# Patient Record
Sex: Male | Born: 1945
Health system: Southern US, Community
[De-identification: ages and names within clinical notes are randomized; demographics above are authoritative.]

## PROBLEM LIST (undated history)

## (undated) DIAGNOSIS — E785 Hyperlipidemia, unspecified: Secondary | ICD-10-CM

## (undated) DIAGNOSIS — I2510 Atherosclerotic heart disease of native coronary artery without angina pectoris: Secondary | ICD-10-CM

## (undated) DIAGNOSIS — M1990 Unspecified osteoarthritis, unspecified site: Secondary | ICD-10-CM

## (undated) HISTORY — DX: Hyperlipidemia, unspecified: E78.5

## (undated) HISTORY — PX: BACK SURGERY: SHX140

## (undated) HISTORY — DX: Atherosclerotic heart disease of native coronary artery without angina pectoris: I25.10

---

## 2010-08-09 HISTORY — PX: BIOPSY THYROID: PRO38

## 2014-04-16 ENCOUNTER — Encounter (HOSPITAL_COMMUNITY): Payer: Self-pay | Admitting: Pharmacy Technician

## 2014-04-16 NOTE — Patient Instructions (Signed)
Dale Elliott  04/16/2014   Your procedure is scheduled on:  04/22/14  Report to [facility] at *7:30 AM.  Call this number if you have problems the morning of surgery: [phone]   Remember:   Do not eat food or drink liquids after midnight.   Take these medicines the morning of surgery with A SIP OF WATER: N/A   Do not wear jewelry, make-up or nail polish.  Do not wear lotions, powders, or perfumes. You may wear deodorant.  Do not shave 48 hours prior to surgery. Men may shave face and neck.  Do not bring valuables to the hospital.  [facility] is not responsible for any belongings or valuables.               Contacts, dentures or bridgework may not be worn into surgery.  Leave suitcase in the car. After surgery it may be brought to your room.  For patients admitted to the hospital, discharge time is determined by your treatment team.               Patients discharged the day of surgery will not be allowed to drive home.  Name and phone number of your driver:   Special Instructions: N/A   Please read over the following fact sheets that you were given: Anesthesia Post-op Instructions   PATIENT INSTRUCTIONS POST-ANESTHESIA  IMMEDIATELY FOLLOWING SURGERY:  Do not drive or operate machinery for the first twenty four hours after surgery.  Do not make any important decisions for twenty four hours after surgery or while taking narcotic pain medications or sedatives.  If you develop intractable nausea and vomiting or a severe headache please notify your doctor immediately.  FOLLOW-UP:  Please make an appointment with your surgeon as instructed. You do not need to follow up with anesthesia unless specifically instructed to do so.  WOUND CARE INSTRUCTIONS (if applicable):  Keep a dry clean dressing on the anesthesia/puncture wound site if there is drainage.  Once the wound has quit draining you may leave it open to air.  Generally you should leave the bandage intact for twenty four hours  unless there is drainage.  If the epidural site drains for more than 36-48 hours please call the anesthesia department.  QUESTIONS?:  Please feel free to call your physician or the hospital operator if you have any questions, and they will be happy to assist you.      Cataract Surgery  A cataract is a clouding of the lens of the eye. When a lens becomes cloudy, vision is reduced based on the degree and nature of the clouding. Surgery may be needed to improve vision. Surgery removes the cloudy lens and usually replaces it with a substitute lens (intraocular lens, IOL). LET YOUR EYE DOCTOR KNOW ABOUT:  Allergies to food or medicine.  Medicines taken including herbs, eye drops, over-the-counter medicines, and creams.  Use of steroids (by mouth or creams).  Previous problems with anesthetics or numbing medicine.  History of bleeding problems or blood clots.  Previous surgery.  Other health problems, including diabetes and kidney problems.  Possibility of pregnancy, if this applies. RISKS AND COMPLICATIONS  Infection.  Inflammation of the eyeball (endophthalmitis) that can spread to both eyes (sympathetic ophthalmia).  Poor wound healing.  If an IOL is inserted, it can later fall out of proper position. This is very uncommon.  Clouding of the part of your eye that holds an IOL in place. This is called an "after-cataract." These  are uncommon but easily treated. BEFORE THE PROCEDURE  Do not eat or drink anything except small amounts of water for 8 to 12 before your surgery, or as directed by your caregiver.  Unless you are told otherwise, continue any eye drops you have been prescribed.  Talk to your primary caregiver about all other medicines that you take (both prescription and nonprescription). In some cases, you may need to stop or change medicines near the time of your surgery. This is most important if you are taking blood-thinning medicine.Do not stop medicines unless you  are told to do so.  Arrange for someone to drive you to and from the procedure.  Do not put contact lenses in either eye on the day of your surgery. PROCEDURE There is more than one method for safely removing a cataract. Your doctor can explain the differences and help determine which is best for you. Phacoemulsification surgery is the most common form of cataract surgery.  An injection is given behind the eye or eye drops are given to make this a painless procedure.  A small cut (incision) is made on the edge of the clear, dome-shaped surface that covers the front of the eye (cornea).  A tiny probe is painlessly inserted into the eye. This device gives off ultrasound waves that soften and break up the cloudy center of the lens. This makes it easier for the cloudy lens to be removed by suction.  An IOL may be implanted.  The normal lens of the eye is covered by a clear capsule. Part of that capsule is intentionally left in the eye to support the IOL.  Your surgeon may or may not use stitches to close the incision. There are other forms of cataract surgery that require a larger incision and stitches to close the eye. This approach is taken in cases where the doctor feels that the cataract cannot be easily removed using phacoemulsification. AFTER THE PROCEDURE  When an IOL is implanted, it does not need care. It becomes a permanent part of your eye and cannot be seen or felt.  Your doctor will schedule follow-up exams to check on your progress.  Review your other medicines with your doctor to see which can be resumed after surgery.  Use eye drops or take medicine as prescribed by your doctor. Document Released: 07/15/2011 Document Revised: 12/10/2013 Document Reviewed: 07/15/2011 [facility] Patient Information 2015 [place], [place]. This information is not intended to replace advice given to you by your health care provider. Make sure you discuss any questions you have with your health care  provider.

## 2014-04-17 ENCOUNTER — Encounter (HOSPITAL_COMMUNITY): Payer: Self-pay

## 2014-04-17 ENCOUNTER — Encounter (HOSPITAL_COMMUNITY)
Admission: RE | Admit: 2014-04-17 | Discharge: 2014-04-17 | Disposition: A | Discharge status: Home / Self Care | Payer: Medicare HMO | Source: Ambulatory Visit | Attending: Ophthalmology | Admitting: Ophthalmology

## 2014-04-17 DIAGNOSIS — H2510 Age-related nuclear cataract, unspecified eye: Secondary | ICD-10-CM | POA: Insufficient documentation

## 2014-04-17 DIAGNOSIS — Z01818 Encounter for other preprocedural examination: Secondary | ICD-10-CM | POA: Diagnosis present

## 2014-04-17 HISTORY — DX: Unspecified osteoarthritis, unspecified site: M19.90

## 2014-04-17 LAB — BASIC METABOLIC PANEL
Anion gap: 10 (ref 5–15)
BUN: 16 mg/dL (ref 6–23)
CO2: 27 mEq/L (ref 19–32)
CREATININE: 0.7 mg/dL (ref 0.50–1.35)
Calcium: 9.3 mg/dL (ref 8.4–10.5)
Chloride: 104 mEq/L (ref 96–112)
GFR calc non Af Amer: >90 mL/min (ref >90)
Glucose, Bld: 104 mg/dL — ABNORMAL HIGH (ref 70–99)
Potassium: 4.3 mEq/L (ref 3.7–5.3)
Sodium: 141 mEq/L (ref 137–147)

## 2014-04-17 LAB — HEMOGLOBIN AND HEMATOCRIT, BLOOD
HCT: 44.1 % (ref 39.0–52.0)
HEMOGLOBIN: 15.2 g/dL (ref 13.0–17.0)

## 2014-04-19 MED ORDER — CYCLOPENTOLATE-PHENYLEPHRINE OP SOLN OPTIME - NO CHARGE
OPHTHALMIC | Status: AC
  Filled 2014-04-19: qty 2

## 2014-04-19 MED ORDER — PHENYLEPHRINE HCL 2.5 % OP SOLN
OPHTHALMIC | Status: AC
  Filled 2014-04-19: qty 15

## 2014-04-19 MED ORDER — NEOMYCIN-POLYMYXIN-DEXAMETH 3.5-10000-0.1 OP SUSP
OPHTHALMIC | Status: AC
  Filled 2014-04-19: qty 5

## 2014-04-19 MED ORDER — LIDOCAINE HCL (PF) 1 % IJ SOLN
INTRAMUSCULAR | Status: AC
  Filled 2014-04-19: qty 2

## 2014-04-19 MED ORDER — LIDOCAINE HCL 3.5 % OP GEL
OPHTHALMIC | Status: AC
  Filled 2014-04-19: qty 1

## 2014-04-19 MED ORDER — TETRACAINE HCL 0.5 % OP SOLN
OPHTHALMIC | Status: AC
  Filled 2014-04-19: qty 2

## 2014-04-22 ENCOUNTER — Ambulatory Visit (HOSPITAL_COMMUNITY): Payer: Medicare HMO | Admitting: Anesthesiology

## 2014-04-22 ENCOUNTER — Encounter (HOSPITAL_COMMUNITY): Payer: Self-pay | Admitting: *Deleted

## 2014-04-22 ENCOUNTER — Encounter (HOSPITAL_COMMUNITY)
Admission: RE | Disposition: A | Discharge status: Home / Self Care | Payer: Self-pay | Source: Ambulatory Visit | Attending: Ophthalmology

## 2014-04-22 ENCOUNTER — Ambulatory Visit (HOSPITAL_COMMUNITY)
Admission: RE | Admit: 2014-04-22 | Discharge: 2014-04-22 | Disposition: A | Discharge status: Home / Self Care | Payer: Medicare HMO | Source: Ambulatory Visit | Attending: Ophthalmology | Admitting: Ophthalmology

## 2014-04-22 ENCOUNTER — Encounter (HOSPITAL_COMMUNITY): Payer: Medicare HMO | Admitting: Anesthesiology

## 2014-04-22 DIAGNOSIS — H2510 Age-related nuclear cataract, unspecified eye: Secondary | ICD-10-CM | POA: Diagnosis present

## 2014-04-22 DIAGNOSIS — F17200 Nicotine dependence, unspecified, uncomplicated: Secondary | ICD-10-CM | POA: Insufficient documentation

## 2014-04-22 DIAGNOSIS — Z7982 Long term (current) use of aspirin: Secondary | ICD-10-CM | POA: Diagnosis not present

## 2014-04-22 HISTORY — PX: CATARACT EXTRACTION W/PHACO: SHX586

## 2014-04-22 SURGERY — PHACOEMULSIFICATION, CATARACT, WITH IOL INSERTION
Anesthesia: Monitor Anesthesia Care | Site: Eye | Laterality: Left

## 2014-04-22 MED ORDER — PHENYLEPHRINE HCL 2.5 % OP SOLN
1.0000 [drp] | OPHTHALMIC | Status: AC
  Administered 2014-04-22 (×3): 1 [drp] via OPHTHALMIC

## 2014-04-22 MED ORDER — LIDOCAINE 3.5 % OP GEL OPTIME - NO CHARGE
OPHTHALMIC | Status: DC | PRN
  Administered 2014-04-22: 2 [drp] via OPHTHALMIC

## 2014-04-22 MED ORDER — EPINEPHRINE HCL 1 MG/ML IJ SOLN
INTRAMUSCULAR | Status: AC
  Filled 2014-04-22: qty 1

## 2014-04-22 MED ORDER — MIDAZOLAM HCL 2 MG/2ML IJ SOLN
INTRAMUSCULAR | Status: AC
  Filled 2014-04-22: qty 2

## 2014-04-22 MED ORDER — LIDOCAINE HCL 3.5 % OP GEL
1.0000 "application " | Freq: Once | OPHTHALMIC | Status: AC
  Administered 2014-04-22: 1 via OPHTHALMIC

## 2014-04-22 MED ORDER — NEOMYCIN-POLYMYXIN-DEXAMETH 3.5-10000-0.1 OP SUSP
OPHTHALMIC | Status: DC | PRN
  Administered 2014-04-22: 2 [drp] via OPHTHALMIC

## 2014-04-22 MED ORDER — MIDAZOLAM HCL 2 MG/2ML IJ SOLN
1.0000 mg | INTRAMUSCULAR | Status: DC | PRN
  Administered 2014-04-22: 2 mg via INTRAVENOUS

## 2014-04-22 MED ORDER — TETRACAINE HCL 0.5 % OP SOLN
1.0000 [drp] | OPHTHALMIC | Status: AC
  Administered 2014-04-22 (×3): 1 [drp] via OPHTHALMIC

## 2014-04-22 MED ORDER — CYCLOPENTOLATE-PHENYLEPHRINE 0.2-1 % OP SOLN
1.0000 [drp] | OPHTHALMIC | Status: AC
  Administered 2014-04-22 (×3): 1 [drp] via OPHTHALMIC

## 2014-04-22 MED ORDER — LIDOCAINE HCL (PF) 1 % IJ SOLN
INTRAMUSCULAR | Status: DC | PRN
  Administered 2014-04-22: .4 mL

## 2014-04-22 MED ORDER — BSS IO SOLN
INTRAOCULAR | Status: DC | PRN
  Administered 2014-04-22: 15 mL via INTRAOCULAR

## 2014-04-22 MED ORDER — FENTANYL CITRATE 0.05 MG/ML IJ SOLN
INTRAMUSCULAR | Status: AC
  Filled 2014-04-22: qty 2

## 2014-04-22 MED ORDER — LACTATED RINGERS IV SOLN
INTRAVENOUS | Status: DC
  Administered 2014-04-22: 08:00:00 via INTRAVENOUS

## 2014-04-22 MED ORDER — KETOROLAC TROMETHAMINE 0.5 % OP SOLN: 1.0000 [drp] | OPHTHALMIC | Status: DC

## 2014-04-22 MED ORDER — EPINEPHRINE HCL 1 MG/ML IJ SOLN
INTRAOCULAR | Status: DC | PRN
  Administered 2014-04-22: 09:00:00

## 2014-04-22 MED ORDER — PROVISC 10 MG/ML IO SOLN
INTRAOCULAR | Status: DC | PRN
  Administered 2014-04-22: 0.85 mL via INTRAOCULAR

## 2014-04-22 MED ORDER — POVIDONE-IODINE 5 % OP SOLN
OPHTHALMIC | Status: DC | PRN
  Administered 2014-04-22: 1 via OPHTHALMIC

## 2014-04-22 MED ORDER — FENTANYL CITRATE 0.05 MG/ML IJ SOLN
25.0000 ug | INTRAMUSCULAR | Status: AC
  Administered 2014-04-22 (×2): 25 ug via INTRAVENOUS

## 2014-04-22 SURGICAL SUPPLY — 33 items
CAPSULAR TENSION RING-AMO (OPHTHALMIC RELATED) IMPLANT
CLOTH BEACON ORANGE TIMEOUT ST (SAFETY) ×1 IMPLANT
EYE SHIELD UNIVERSAL CLEAR (GAUZE/BANDAGES/DRESSINGS) ×1 IMPLANT
GLOVE BIO SURGEON STRL SZ 6.5 (GLOVE) IMPLANT
GLOVE BIOGEL PI IND STRL 6.5 (GLOVE) IMPLANT
GLOVE BIOGEL PI IND STRL 7.0 (GLOVE) IMPLANT
GLOVE BIOGEL PI IND STRL 7.5 (GLOVE) IMPLANT
GLOVE BIOGEL PI INDICATOR 6.5 (GLOVE)
GLOVE BIOGEL PI INDICATOR 7.0 (GLOVE) ×1
GLOVE BIOGEL PI INDICATOR 7.5 (GLOVE)
GLOVE ECLIPSE 6.5 STRL STRAW (GLOVE) IMPLANT
GLOVE ECLIPSE 7.0 STRL STRAW (GLOVE) IMPLANT
GLOVE ECLIPSE 7.5 STRL STRAW (GLOVE) IMPLANT
GLOVE EXAM NITRILE LRG STRL (GLOVE) IMPLANT
GLOVE EXAM NITRILE MD LF STRL (GLOVE) ×1 IMPLANT
GLOVE SKINSENSE NS SZ6.5 (GLOVE)
GLOVE SKINSENSE NS SZ7.0 (GLOVE)
GLOVE SKINSENSE STRL SZ6.5 (GLOVE) IMPLANT
GLOVE SKINSENSE STRL SZ7.0 (GLOVE) IMPLANT
KIT VITRECTOMY (OPHTHALMIC RELATED) IMPLANT
PAD ARMBOARD 7.5X6 YLW CONV (MISCELLANEOUS) ×1 IMPLANT
PROC W NO LENS (INTRAOCULAR LENS)
PROC W SPEC LENS (INTRAOCULAR LENS)
PROCESS W NO LENS (INTRAOCULAR LENS) IMPLANT
PROCESS W SPEC LENS (INTRAOCULAR LENS) IMPLANT
RETRACTOR IRIS SIGHTPATH (OPHTHALMIC RELATED) IMPLANT
RING MALYGIN (MISCELLANEOUS) IMPLANT
SIGHTPATH CAT PROC W REG LENS (Ophthalmic Related) ×2 IMPLANT
SYRINGE LUER LOK 1CC (MISCELLANEOUS) ×1 IMPLANT
TAPE SURG TRANSPORE 1 IN (GAUZE/BANDAGES/DRESSINGS) IMPLANT
TAPE SURGICAL TRANSPORE 1 IN (GAUZE/BANDAGES/DRESSINGS) ×1
VISCOELASTIC ADDITIONAL (OPHTHALMIC RELATED) IMPLANT
WATER STERILE IRR 250ML POUR (IV SOLUTION) ×1 IMPLANT

## 2014-04-22 NOTE — Transfer of Care (Signed)
Immediate Anesthesia Transfer of Care Note  Patient: Dale Elliott  Procedure(s) Performed: Procedure(s) with comments: CATARACT EXTRACTION PHACO AND INTRAOCULAR LENS PLACEMENT (IOC) (Left) - CDE:  9.68  Patient Location: Short Stay  Anesthesia Type:MAC  Level of Consciousness: awake  Airway & Oxygen Therapy: Patient Spontanous Breathing  Post-op Assessment: Report given to PACU RN  Post vital signs: Reviewed  Complications: No apparent anesthesia complications

## 2014-04-22 NOTE — Anesthesia Postprocedure Evaluation (Signed)
  Anesthesia Post-op Note  Patient: Dale Elliott  Procedure(s) Performed: Procedure(s) with comments: CATARACT EXTRACTION PHACO AND INTRAOCULAR LENS PLACEMENT (IOC) (Left) - CDE:  9.68  Patient Location: Short Stay  Anesthesia Type:MAC  Level of Consciousness: awake, alert  and oriented  Airway and Oxygen Therapy: Patient Spontanous Breathing and Patient connected to face mask oxygen  Post-op Pain: none  Post-op Assessment: Post-op Vital signs reviewed, Patient's Cardiovascular Status Stable, Respiratory Function Stable, Patent Airway and No signs of Nausea or vomiting  Post-op Vital Signs: Reviewed and stable  Last Vitals:  Filed Vitals:   04/22/14 0850  BP: 112/59  Temp:   Resp: 23    Complications: No apparent anesthesia complications

## 2014-04-22 NOTE — Op Note (Signed)
Date of Admission: 04/22/2014  Date of Surgery: 04/22/2014   Pre-Op Dx: Cataract Left Eye  Post-Op Dx: Senile Nuclear  Cataract Left  Eye,  Dx Code 366.16  Surgeon: Tonny Branch, M.D.  Assistants: None  Anesthesia: Topical with MAC  Indications: Painless, progressive loss of vision with compromise of daily activities.  Surgery: Cataract Extraction with Intraocular lens Implant Left Eye  Discription: The patient had dilating drops and viscous lidocaine placed into the Left eye in the pre-op holding area. After transfer to the operating room, a time out was performed. The patient was then prepped and draped. Beginning with a 17 degree blade a paracentesis port was made at the surgeon's 2 o'clock position. The anterior chamber was then filled with 1% non-preserved lidocaine. This was followed by filling the anterior chamber with Provisc.  A 2.71mm keratome blade was used to make a clear corneal incision at the temporal limbus.  A bent cystatome needle was used to create a continuous tear capsulotomy. Hydrodissection was performed with balanced salt solution on a Fine canula. The lens nucleus was then removed using the phacoemulsification handpiece. Residual cortex was removed with the I&A handpiece. The anterior chamber and capsular bag were refilled with Provisc. A posterior chamber intraocular lens was placed into the capsular bag with it's injector. The implant was positioned with the Kuglan hook. The Provisc was then removed from the anterior chamber and capsular bag with the I&A handpiece. Stromal hydration of the main incision and paracentesis port was performed with BSS on a Fine canula. The wounds were tested for leak which was negative. The patient tolerated the procedure well. There were no operative complications. The patient was then transferred to the recovery room in stable condition.  Complications: None  Specimen: None  EBL: None  Prosthetic device: Hoya iSert 250, power 18.5 D, SN  [id number].

## 2014-04-22 NOTE — Anesthesia Preprocedure Evaluation (Signed)
Anesthesia Evaluation  Patient identified by MRN, date of birth, ID band Patient awake    Reviewed: Allergy & Precautions, H&P , NPO status , Patient's Chart, lab work & pertinent test results  Airway Mallampati: II TM Distance: >3 FB     Dental  (+) Edentulous Upper, Edentulous Lower   Pulmonary Current Smoker (am cough),  breath sounds clear to auscultation        Cardiovascular negative cardio ROS  Rhythm:Regular Rate:Normal     Neuro/Psych    GI/Hepatic negative GI ROS,   Endo/Other    Renal/GU      Musculoskeletal   Abdominal   Peds  Hematology   Anesthesia Other Findings   Reproductive/Obstetrics                           Anesthesia Physical Anesthesia Plan  ASA: II  Anesthesia Plan: MAC   Post-op Pain Management:    Induction: Intravenous  Airway Management Planned: Nasal Cannula  Additional Equipment:   Intra-op Plan:   Post-operative Plan:   Informed Consent: I have reviewed the patients History and Physical, chart, labs and discussed the procedure including the risks, benefits and alternatives for the proposed anesthesia with the patient or authorized representative who has indicated his/her understanding and acceptance.     Plan Discussed with:   Anesthesia Plan Comments:         Anesthesia Quick Evaluation

## 2014-04-22 NOTE — H&P (Signed)
I have reviewed the H&P, the patient was re-examined, and I have identified no interval changes in medical condition and plan of care since the history and physical of record  

## 2014-04-22 NOTE — OR Nursing (Signed)
Pacemaker was checked at time of PAT assessment . He does NOT have a pacemaker.  Confirmed by Patient.

## 2014-04-22 NOTE — Discharge Instructions (Signed)

## 2014-04-23 ENCOUNTER — Encounter (HOSPITAL_COMMUNITY): Payer: Self-pay | Admitting: Ophthalmology

## 2014-05-01 ENCOUNTER — Encounter (HOSPITAL_COMMUNITY): Payer: Self-pay | Admitting: Pharmacy Technician

## 2014-05-03 MED ORDER — FENTANYL CITRATE 0.05 MG/ML IJ SOLN: 25.0000 ug | INTRAMUSCULAR | Status: DC | PRN

## 2014-05-03 MED ORDER — ONDANSETRON HCL 4 MG/2ML IJ SOLN: 4.0000 mg | Freq: Once | INTRAMUSCULAR | Status: AC | PRN

## 2014-05-06 ENCOUNTER — Encounter (HOSPITAL_COMMUNITY)
Admission: RE | Admit: 2014-05-06 | Discharge: 2014-05-06 | Disposition: A | Discharge status: Home / Self Care | Payer: Medicare HMO | Source: Ambulatory Visit | Attending: Ophthalmology | Admitting: Ophthalmology

## 2014-05-06 ENCOUNTER — Other Ambulatory Visit (HOSPITAL_COMMUNITY): Payer: Self-pay | Admitting: Family Medicine

## 2014-05-06 DIAGNOSIS — E041 Nontoxic single thyroid nodule: Secondary | ICD-10-CM

## 2014-05-08 MED ORDER — LIDOCAINE HCL 3.5 % OP GEL
OPHTHALMIC | Status: AC
  Filled 2014-05-08: qty 1

## 2014-05-08 MED ORDER — PHENYLEPHRINE HCL 2.5 % OP SOLN
OPHTHALMIC | Status: AC
  Filled 2014-05-08: qty 15

## 2014-05-08 MED ORDER — LIDOCAINE HCL (PF) 1 % IJ SOLN
INTRAMUSCULAR | Status: AC
  Filled 2014-05-08: qty 2

## 2014-05-08 MED ORDER — TETRACAINE HCL 0.5 % OP SOLN
OPHTHALMIC | Status: AC
  Filled 2014-05-08: qty 2

## 2014-05-08 MED ORDER — NEOMYCIN-POLYMYXIN-DEXAMETH 3.5-10000-0.1 OP SUSP
OPHTHALMIC | Status: AC
  Filled 2014-05-08: qty 5

## 2014-05-08 MED ORDER — CYCLOPENTOLATE-PHENYLEPHRINE OP SOLN OPTIME - NO CHARGE
OPHTHALMIC | Status: AC
  Filled 2014-05-08: qty 2

## 2014-05-08 NOTE — Pre-Procedure Instructions (Signed)
Message left on VM to call back ASAP for pre op telephone call, as surgery is scheduled for tomorrow 10/1 and will have to be rescheduled if we do not hear from him.  This is the 6 th attempt to contact patient.  Numbers listed are [phone] (where message was left) and [phone] (which is not a working number).

## 2014-05-09 ENCOUNTER — Encounter (HOSPITAL_COMMUNITY): Payer: Self-pay | Admitting: *Deleted

## 2014-05-09 ENCOUNTER — Encounter (HOSPITAL_COMMUNITY)
Admission: RE | Disposition: A | Discharge status: Home / Self Care | Payer: Self-pay | Source: Ambulatory Visit | Attending: Ophthalmology

## 2014-05-09 ENCOUNTER — Encounter (HOSPITAL_COMMUNITY): Payer: Medicare HMO | Admitting: Anesthesiology

## 2014-05-09 ENCOUNTER — Ambulatory Visit (HOSPITAL_COMMUNITY)
Admission: RE | Admit: 2014-05-09 | Discharge: 2014-05-09 | Disposition: A | Discharge status: Home / Self Care | Payer: Medicare HMO | Source: Ambulatory Visit | Attending: Ophthalmology | Admitting: Ophthalmology

## 2014-05-09 ENCOUNTER — Ambulatory Visit (HOSPITAL_COMMUNITY): Payer: Medicare HMO

## 2014-05-09 ENCOUNTER — Ambulatory Visit (HOSPITAL_COMMUNITY): Payer: Medicare HMO | Admitting: Anesthesiology

## 2014-05-09 DIAGNOSIS — Z7982 Long term (current) use of aspirin: Secondary | ICD-10-CM | POA: Insufficient documentation

## 2014-05-09 DIAGNOSIS — F17200 Nicotine dependence, unspecified, uncomplicated: Secondary | ICD-10-CM | POA: Diagnosis not present

## 2014-05-09 DIAGNOSIS — H25811 Combined forms of age-related cataract, right eye: Secondary | ICD-10-CM | POA: Insufficient documentation

## 2014-05-09 HISTORY — PX: CATARACT EXTRACTION W/PHACO: SHX586

## 2014-05-09 SURGERY — PHACOEMULSIFICATION, CATARACT, WITH IOL INSERTION
Anesthesia: Monitor Anesthesia Care | Site: Eye | Laterality: Right

## 2014-05-09 MED ORDER — PROVISC 10 MG/ML IO SOLN
INTRAOCULAR | Status: DC | PRN
  Administered 2014-05-09: 0.85 mL via INTRAOCULAR

## 2014-05-09 MED ORDER — EPINEPHRINE HCL 1 MG/ML IJ SOLN
INTRAMUSCULAR | Status: AC
  Filled 2014-05-09: qty 1

## 2014-05-09 MED ORDER — LACTATED RINGERS IV SOLN
INTRAVENOUS | Status: DC
  Administered 2014-05-09: 10:00:00 via INTRAVENOUS

## 2014-05-09 MED ORDER — MIDAZOLAM HCL 2 MG/2ML IJ SOLN
INTRAMUSCULAR | Status: AC
  Filled 2014-05-09: qty 2

## 2014-05-09 MED ORDER — TETRACAINE HCL 0.5 % OP SOLN
1.0000 [drp] | OPHTHALMIC | Status: AC
  Administered 2014-05-09 (×3): 1 [drp] via OPHTHALMIC

## 2014-05-09 MED ORDER — NEOMYCIN-POLYMYXIN-DEXAMETH 3.5-10000-0.1 OP SUSP
OPHTHALMIC | Status: DC | PRN
  Administered 2014-05-09: 2 [drp] via OPHTHALMIC

## 2014-05-09 MED ORDER — FENTANYL CITRATE 0.05 MG/ML IJ SOLN
25.0000 ug | INTRAMUSCULAR | Status: AC
Start: 2014-05-09 — End: 2014-05-09
  Administered 2014-05-09: 25 ug via INTRAVENOUS

## 2014-05-09 MED ORDER — EPINEPHRINE HCL 1 MG/ML IJ SOLN
INTRAOCULAR | Status: DC | PRN
  Administered 2014-05-09: 10:00:00

## 2014-05-09 MED ORDER — BSS IO SOLN
INTRAOCULAR | Status: DC | PRN
  Administered 2014-05-09: 15 mL

## 2014-05-09 MED ORDER — PHENYLEPHRINE HCL 2.5 % OP SOLN
1.0000 [drp] | OPHTHALMIC | Status: AC
  Administered 2014-05-09 (×3): 1 [drp] via OPHTHALMIC

## 2014-05-09 MED ORDER — POVIDONE-IODINE 5 % OP SOLN
OPHTHALMIC | Status: DC | PRN
  Administered 2014-05-09: 1 via OPHTHALMIC

## 2014-05-09 MED ORDER — FENTANYL CITRATE 0.05 MG/ML IJ SOLN
INTRAMUSCULAR | Status: AC
  Filled 2014-05-09: qty 2

## 2014-05-09 MED ORDER — MIDAZOLAM HCL 2 MG/2ML IJ SOLN
1.0000 mg | INTRAMUSCULAR | Status: DC | PRN
  Administered 2014-05-09: 2 mg via INTRAVENOUS

## 2014-05-09 MED ORDER — LIDOCAINE HCL 3.5 % OP GEL
1.0000 "application " | Freq: Once | OPHTHALMIC | Status: AC
  Administered 2014-05-09: 1 via OPHTHALMIC

## 2014-05-09 MED ORDER — CYCLOPENTOLATE-PHENYLEPHRINE 0.2-1 % OP SOLN
1.0000 [drp] | OPHTHALMIC | Status: AC
  Administered 2014-05-09 (×3): 1 [drp] via OPHTHALMIC

## 2014-05-09 MED ORDER — LIDOCAINE HCL (PF) 1 % IJ SOLN
INTRAMUSCULAR | Status: DC | PRN
  Administered 2014-05-09: .5 mL

## 2014-05-09 SURGICAL SUPPLY — 12 items
CLOTH BEACON ORANGE TIMEOUT ST (SAFETY) ×1 IMPLANT
EYE SHIELD UNIVERSAL CLEAR (GAUZE/BANDAGES/DRESSINGS) ×1 IMPLANT
GLOVE BIOGEL PI IND STRL 6.5 (GLOVE) IMPLANT
GLOVE BIOGEL PI IND STRL 7.0 (GLOVE) IMPLANT
GLOVE BIOGEL PI INDICATOR 6.5 (GLOVE) ×1
GLOVE BIOGEL PI INDICATOR 7.0 (GLOVE) ×1
PAD ARMBOARD 7.5X6 YLW CONV (MISCELLANEOUS) ×1 IMPLANT
SIGHTPATH CAT PROC W REG LENS (Ophthalmic Related) ×2 IMPLANT
SYRINGE LUER LOK 1CC (MISCELLANEOUS) ×1 IMPLANT
TAPE SURG TRANSPORE 1 IN (GAUZE/BANDAGES/DRESSINGS) IMPLANT
TAPE SURGICAL TRANSPORE 1 IN (GAUZE/BANDAGES/DRESSINGS) ×1
WATER STERILE IRR 250ML POUR (IV SOLUTION) ×1 IMPLANT

## 2014-05-09 NOTE — Anesthesia Procedure Notes (Signed)
Procedure Name: MAC Date/Time: 05/09/2014 10:05 AM Performed by: Vista Deck Pre-anesthesia Checklist: Patient identified, Emergency Drugs available, Suction available, Timeout performed and Patient being monitored Patient Re-evaluated:Patient Re-evaluated prior to inductionOxygen Delivery Method: Nasal Cannula

## 2014-05-09 NOTE — Transfer of Care (Signed)
Immediate Anesthesia Transfer of Care Note  Patient: Dale Elliott  Procedure(s) Performed: Procedure(s) (LRB): CATARACT EXTRACTION PHACO AND INTRAOCULAR LENS PLACEMENT RIGHT EYE CDE=15.76 (Right)  Patient Location: Shortstay  Anesthesia Type: MAC  Level of Consciousness: awake  Airway & Oxygen Therapy: Patient Spontanous Breathing   Post-op Assessment: Report given to PACU RN, Post -op Vital signs reviewed and stable and Patient moving all extremities  Post vital signs: Reviewed and stable  Complications: No apparent anesthesia complications

## 2014-05-09 NOTE — H&P (Signed)
I have reviewed the H&P, the patient was re-examined, and I have identified no interval changes in medical condition and plan of care since the history and physical of record  

## 2014-05-09 NOTE — Discharge Instructions (Signed)

## 2014-05-09 NOTE — Anesthesia Preprocedure Evaluation (Signed)
Anesthesia Evaluation  Patient identified by MRN, date of birth, ID band Patient awake    Reviewed: Allergy & Precautions, H&P , NPO status , Patient's Chart, lab work & pertinent test results  Airway Mallampati: II TM Distance: >3 FB     Dental  (+) Edentulous Upper, Edentulous Lower   Pulmonary Current Smoker,  breath sounds clear to auscultation        Cardiovascular negative cardio ROS  Rhythm:Regular Rate:Normal     Neuro/Psych    GI/Hepatic negative GI ROS,   Endo/Other    Renal/GU      Musculoskeletal   Abdominal   Peds  Hematology   Anesthesia Other Findings   Reproductive/Obstetrics                           Anesthesia Physical Anesthesia Plan  ASA: II  Anesthesia Plan: MAC   Post-op Pain Management:    Induction: Intravenous  Airway Management Planned: Nasal Cannula  Additional Equipment:   Intra-op Plan:   Post-operative Plan:   Informed Consent: I have reviewed the patients History and Physical, chart, labs and discussed the procedure including the risks, benefits and alternatives for the proposed anesthesia with the patient or authorized representative who has indicated his/her understanding and acceptance.     Plan Discussed with:   Anesthesia Plan Comments:         Anesthesia Quick Evaluation

## 2014-05-09 NOTE — Op Note (Signed)
Date of Admission: 05/09/2014  Date of Surgery: 05/09/2014   Pre-Op Dx: Cataract Right Eye  Post-Op Dx: Senile Nuclear  Cataract Right  Eye,  Dx Code H25.11  Surgeon: Tonny Branch, M.D.  Assistants: None  Anesthesia: Topical with MAC  Indications: Painless, progressive loss of vision with compromise of daily activities.  Surgery: Cataract Extraction with Intraocular lens Implant Right Eye  Discription: The patient had dilating drops and viscous lidocaine placed into the Right eye in the pre-op holding area. After transfer to the operating room, a time out was performed. The patient was then prepped and draped. Beginning with a 47 degree blade a paracentesis port was made at the surgeon's 2 o'clock position. The anterior chamber was then filled with 1% non-preserved lidocaine. This was followed by filling the anterior chamber with Provisc.  A 2.74mm keratome blade was used to make a clear corneal incision at the temporal limbus.  A bent cystatome needle was used to create a continuous tear capsulotomy. Hydrodissection was performed with balanced salt solution on a Fine canula. The lens nucleus was then removed using the phacoemulsification handpiece. Residual cortex was removed with the I&A handpiece. The anterior chamber and capsular bag were refilled with Provisc. A posterior chamber intraocular lens was placed into the capsular bag with it's injector. The implant was positioned with the Kuglan hook. The Provisc was then removed from the anterior chamber and capsular bag with the I&A handpiece. Stromal hydration of the main incision and paracentesis port was performed with BSS on a Fine canula. The wounds were tested for leak which was negative. The patient tolerated the procedure well. There were no operative complications. The patient was then transferred to the recovery room in stable condition.  Complications: None  Specimen: None  EBL: None  Prosthetic device: Hoya iSert 250, power 18.5 D,  SN [id number].

## 2014-05-09 NOTE — Anesthesia Postprocedure Evaluation (Signed)
  Anesthesia Post-op Note  Patient: Dale Elliott  Procedure(s) Performed: Procedure(s) (LRB): CATARACT EXTRACTION PHACO AND INTRAOCULAR LENS PLACEMENT RIGHT EYE CDE=15.76 (Right)  Patient Location:  Short Stay  Anesthesia Type: MAC  Level of Consciousness: awake  Airway and Oxygen Therapy: Patient Spontanous Breathing  Post-op Pain: none  Post-op Assessment: Post-op Vital signs reviewed, Patient's Cardiovascular Status Stable, Respiratory Function Stable, Patent Airway, No signs of Nausea or vomiting and Pain level controlled  Post-op Vital Signs: Reviewed and stable  Complications: No apparent anesthesia complications

## 2014-05-10 ENCOUNTER — Encounter (HOSPITAL_COMMUNITY): Payer: Self-pay | Admitting: Ophthalmology

## 2014-05-15 ENCOUNTER — Ambulatory Visit (HOSPITAL_COMMUNITY)
Admission: RE | Admit: 2014-05-15 | Discharge: 2014-05-15 | Disposition: A | Discharge status: Home / Self Care | Payer: Medicare HMO | Source: Ambulatory Visit | Attending: Family Medicine | Admitting: Family Medicine

## 2014-05-15 DIAGNOSIS — E041 Nontoxic single thyroid nodule: Secondary | ICD-10-CM | POA: Diagnosis not present

## 2015-05-15 DIAGNOSIS — K5900 Constipation, unspecified: Secondary | ICD-10-CM | POA: Diagnosis not present

## 2015-05-15 DIAGNOSIS — S93491A Sprain of other ligament of right ankle, initial encounter: Secondary | ICD-10-CM | POA: Diagnosis not present

## 2015-05-15 DIAGNOSIS — E041 Nontoxic single thyroid nodule: Secondary | ICD-10-CM | POA: Diagnosis not present

## 2015-05-15 DIAGNOSIS — Z720 Tobacco use: Secondary | ICD-10-CM | POA: Diagnosis not present

## 2015-05-15 DIAGNOSIS — Z23 Encounter for immunization: Secondary | ICD-10-CM | POA: Diagnosis not present

## 2015-05-15 DIAGNOSIS — G4700 Insomnia, unspecified: Secondary | ICD-10-CM | POA: Diagnosis not present

## 2015-05-15 DIAGNOSIS — E782 Mixed hyperlipidemia: Secondary | ICD-10-CM | POA: Diagnosis not present

## 2015-05-15 DIAGNOSIS — E559 Vitamin D deficiency, unspecified: Secondary | ICD-10-CM | POA: Diagnosis not present

## 2015-05-19 DIAGNOSIS — L570 Actinic keratosis: Secondary | ICD-10-CM | POA: Diagnosis not present

## 2015-09-23 DIAGNOSIS — L570 Actinic keratosis: Secondary | ICD-10-CM | POA: Diagnosis not present

## 2015-11-20 DIAGNOSIS — M1991 Primary osteoarthritis, unspecified site: Secondary | ICD-10-CM | POA: Diagnosis not present

## 2015-11-20 DIAGNOSIS — E559 Vitamin D deficiency, unspecified: Secondary | ICD-10-CM | POA: Diagnosis not present

## 2015-11-20 DIAGNOSIS — E782 Mixed hyperlipidemia: Secondary | ICD-10-CM | POA: Diagnosis not present

## 2015-11-20 DIAGNOSIS — Z7689 Persons encountering health services in other specified circumstances: Secondary | ICD-10-CM | POA: Diagnosis not present

## 2015-11-20 DIAGNOSIS — E041 Nontoxic single thyroid nodule: Secondary | ICD-10-CM | POA: Diagnosis not present

## 2015-11-27 DIAGNOSIS — M19012 Primary osteoarthritis, left shoulder: Secondary | ICD-10-CM | POA: Diagnosis not present

## 2015-11-27 DIAGNOSIS — L408 Other psoriasis: Secondary | ICD-10-CM | POA: Diagnosis not present

## 2015-11-27 DIAGNOSIS — E041 Nontoxic single thyroid nodule: Secondary | ICD-10-CM | POA: Diagnosis not present

## 2015-11-27 DIAGNOSIS — E782 Mixed hyperlipidemia: Secondary | ICD-10-CM | POA: Diagnosis not present

## 2015-11-27 DIAGNOSIS — G4700 Insomnia, unspecified: Secondary | ICD-10-CM | POA: Diagnosis not present

## 2015-11-27 DIAGNOSIS — E559 Vitamin D deficiency, unspecified: Secondary | ICD-10-CM | POA: Diagnosis not present

## 2015-11-27 DIAGNOSIS — Z720 Tobacco use: Secondary | ICD-10-CM | POA: Diagnosis not present

## 2015-11-27 DIAGNOSIS — Z0001 Encounter for general adult medical examination with abnormal findings: Secondary | ICD-10-CM | POA: Diagnosis not present

## 2016-03-23 DIAGNOSIS — D485 Neoplasm of uncertain behavior of skin: Secondary | ICD-10-CM | POA: Diagnosis not present

## 2016-03-23 DIAGNOSIS — L570 Actinic keratosis: Secondary | ICD-10-CM | POA: Diagnosis not present

## 2016-03-23 DIAGNOSIS — Z85828 Personal history of other malignant neoplasm of skin: Secondary | ICD-10-CM | POA: Diagnosis not present

## 2016-05-24 DIAGNOSIS — E559 Vitamin D deficiency, unspecified: Secondary | ICD-10-CM | POA: Diagnosis not present

## 2016-05-24 DIAGNOSIS — E782 Mixed hyperlipidemia: Secondary | ICD-10-CM | POA: Diagnosis not present

## 2016-05-24 DIAGNOSIS — E041 Nontoxic single thyroid nodule: Secondary | ICD-10-CM | POA: Diagnosis not present

## 2016-05-27 DIAGNOSIS — E559 Vitamin D deficiency, unspecified: Secondary | ICD-10-CM | POA: Diagnosis not present

## 2016-05-27 DIAGNOSIS — Z720 Tobacco use: Secondary | ICD-10-CM | POA: Diagnosis not present

## 2016-05-27 DIAGNOSIS — Z6826 Body mass index (BMI) 26.0-26.9, adult: Secondary | ICD-10-CM | POA: Diagnosis not present

## 2016-05-27 DIAGNOSIS — K5900 Constipation, unspecified: Secondary | ICD-10-CM | POA: Diagnosis not present

## 2016-05-27 DIAGNOSIS — Z23 Encounter for immunization: Secondary | ICD-10-CM | POA: Diagnosis not present

## 2016-05-27 DIAGNOSIS — E782 Mixed hyperlipidemia: Secondary | ICD-10-CM | POA: Diagnosis not present

## 2016-09-21 DIAGNOSIS — L570 Actinic keratosis: Secondary | ICD-10-CM | POA: Diagnosis not present

## 2016-11-22 DIAGNOSIS — Z0001 Encounter for general adult medical examination with abnormal findings: Secondary | ICD-10-CM | POA: Diagnosis not present

## 2016-11-25 DIAGNOSIS — H5310 Unspecified subjective visual disturbances: Secondary | ICD-10-CM | POA: Diagnosis not present

## 2016-11-25 DIAGNOSIS — H26493 Other secondary cataract, bilateral: Secondary | ICD-10-CM | POA: Diagnosis not present

## 2016-11-25 DIAGNOSIS — Z961 Presence of intraocular lens: Secondary | ICD-10-CM | POA: Diagnosis not present

## 2016-11-25 DIAGNOSIS — H43393 Other vitreous opacities, bilateral: Secondary | ICD-10-CM | POA: Diagnosis not present

## 2016-11-26 DIAGNOSIS — Z0001 Encounter for general adult medical examination with abnormal findings: Secondary | ICD-10-CM | POA: Diagnosis not present

## 2016-11-26 DIAGNOSIS — Z720 Tobacco use: Secondary | ICD-10-CM | POA: Diagnosis not present

## 2016-11-26 DIAGNOSIS — M19012 Primary osteoarthritis, left shoulder: Secondary | ICD-10-CM | POA: Diagnosis not present

## 2016-11-26 DIAGNOSIS — E782 Mixed hyperlipidemia: Secondary | ICD-10-CM | POA: Diagnosis not present

## 2016-11-26 DIAGNOSIS — E041 Nontoxic single thyroid nodule: Secondary | ICD-10-CM | POA: Diagnosis not present

## 2016-11-26 DIAGNOSIS — Z6826 Body mass index (BMI) 26.0-26.9, adult: Secondary | ICD-10-CM | POA: Diagnosis not present

## 2016-11-26 DIAGNOSIS — E559 Vitamin D deficiency, unspecified: Secondary | ICD-10-CM | POA: Diagnosis not present

## 2016-11-26 DIAGNOSIS — L408 Other psoriasis: Secondary | ICD-10-CM | POA: Diagnosis not present

## 2016-11-29 DIAGNOSIS — H26493 Other secondary cataract, bilateral: Secondary | ICD-10-CM | POA: Diagnosis not present

## 2016-12-28 DIAGNOSIS — Z961 Presence of intraocular lens: Secondary | ICD-10-CM | POA: Diagnosis not present

## 2016-12-28 DIAGNOSIS — Z9849 Cataract extraction status, unspecified eye: Secondary | ICD-10-CM | POA: Diagnosis not present

## 2016-12-28 DIAGNOSIS — H43393 Other vitreous opacities, bilateral: Secondary | ICD-10-CM | POA: Diagnosis not present

## 2017-03-22 DIAGNOSIS — L570 Actinic keratosis: Secondary | ICD-10-CM | POA: Diagnosis not present

## 2017-05-23 DIAGNOSIS — E559 Vitamin D deficiency, unspecified: Secondary | ICD-10-CM | POA: Diagnosis not present

## 2017-05-23 DIAGNOSIS — E782 Mixed hyperlipidemia: Secondary | ICD-10-CM | POA: Diagnosis not present

## 2017-05-23 DIAGNOSIS — M19012 Primary osteoarthritis, left shoulder: Secondary | ICD-10-CM | POA: Diagnosis not present

## 2017-05-23 DIAGNOSIS — M1991 Primary osteoarthritis, unspecified site: Secondary | ICD-10-CM | POA: Diagnosis not present

## 2017-05-23 DIAGNOSIS — Z720 Tobacco use: Secondary | ICD-10-CM | POA: Diagnosis not present

## 2017-05-23 DIAGNOSIS — E041 Nontoxic single thyroid nodule: Secondary | ICD-10-CM | POA: Diagnosis not present

## 2017-05-25 DIAGNOSIS — M19012 Primary osteoarthritis, left shoulder: Secondary | ICD-10-CM | POA: Diagnosis not present

## 2017-05-25 DIAGNOSIS — Z720 Tobacco use: Secondary | ICD-10-CM | POA: Diagnosis not present

## 2017-05-25 DIAGNOSIS — Z6827 Body mass index (BMI) 27.0-27.9, adult: Secondary | ICD-10-CM | POA: Diagnosis not present

## 2017-05-25 DIAGNOSIS — Z1389 Encounter for screening for other disorder: Secondary | ICD-10-CM | POA: Diagnosis not present

## 2017-05-25 DIAGNOSIS — Z23 Encounter for immunization: Secondary | ICD-10-CM | POA: Diagnosis not present

## 2017-05-25 DIAGNOSIS — E782 Mixed hyperlipidemia: Secondary | ICD-10-CM | POA: Diagnosis not present

## 2017-05-25 DIAGNOSIS — E559 Vitamin D deficiency, unspecified: Secondary | ICD-10-CM | POA: Diagnosis not present

## 2017-09-26 DIAGNOSIS — L570 Actinic keratosis: Secondary | ICD-10-CM | POA: Diagnosis not present

## 2017-11-28 DIAGNOSIS — E782 Mixed hyperlipidemia: Secondary | ICD-10-CM | POA: Diagnosis not present

## 2017-11-28 DIAGNOSIS — L408 Other psoriasis: Secondary | ICD-10-CM | POA: Diagnosis not present

## 2017-11-28 DIAGNOSIS — G4700 Insomnia, unspecified: Secondary | ICD-10-CM | POA: Diagnosis not present

## 2017-11-28 DIAGNOSIS — E041 Nontoxic single thyroid nodule: Secondary | ICD-10-CM | POA: Diagnosis not present

## 2017-11-28 DIAGNOSIS — Z720 Tobacco use: Secondary | ICD-10-CM | POA: Diagnosis not present

## 2017-11-30 DIAGNOSIS — Z0001 Encounter for general adult medical examination with abnormal findings: Secondary | ICD-10-CM | POA: Diagnosis not present

## 2017-11-30 DIAGNOSIS — L408 Other psoriasis: Secondary | ICD-10-CM | POA: Diagnosis not present

## 2017-11-30 DIAGNOSIS — Z6826 Body mass index (BMI) 26.0-26.9, adult: Secondary | ICD-10-CM | POA: Diagnosis not present

## 2017-11-30 DIAGNOSIS — M19012 Primary osteoarthritis, left shoulder: Secondary | ICD-10-CM | POA: Diagnosis not present

## 2017-11-30 DIAGNOSIS — E782 Mixed hyperlipidemia: Secondary | ICD-10-CM | POA: Diagnosis not present

## 2017-11-30 DIAGNOSIS — G4700 Insomnia, unspecified: Secondary | ICD-10-CM | POA: Diagnosis not present

## 2017-11-30 DIAGNOSIS — K5900 Constipation, unspecified: Secondary | ICD-10-CM | POA: Diagnosis not present

## 2018-02-28 DIAGNOSIS — M5412 Radiculopathy, cervical region: Secondary | ICD-10-CM | POA: Diagnosis not present

## 2018-02-28 DIAGNOSIS — Z6825 Body mass index (BMI) 25.0-25.9, adult: Secondary | ICD-10-CM | POA: Diagnosis not present

## 2018-02-28 DIAGNOSIS — S161XXA Strain of muscle, fascia and tendon at neck level, initial encounter: Secondary | ICD-10-CM | POA: Diagnosis not present

## 2018-03-08 DIAGNOSIS — M9901 Segmental and somatic dysfunction of cervical region: Secondary | ICD-10-CM | POA: Diagnosis not present

## 2018-03-08 DIAGNOSIS — M50322 Other cervical disc degeneration at C5-C6 level: Secondary | ICD-10-CM | POA: Diagnosis not present

## 2018-03-09 DIAGNOSIS — M50322 Other cervical disc degeneration at C5-C6 level: Secondary | ICD-10-CM | POA: Diagnosis not present

## 2018-03-09 DIAGNOSIS — M9901 Segmental and somatic dysfunction of cervical region: Secondary | ICD-10-CM | POA: Diagnosis not present

## 2018-05-03 DIAGNOSIS — H43393 Other vitreous opacities, bilateral: Secondary | ICD-10-CM | POA: Diagnosis not present

## 2018-05-03 DIAGNOSIS — Z0100 Encounter for examination of eyes and vision without abnormal findings: Secondary | ICD-10-CM | POA: Diagnosis not present

## 2018-05-03 DIAGNOSIS — H524 Presbyopia: Secondary | ICD-10-CM | POA: Diagnosis not present

## 2018-05-25 DIAGNOSIS — E041 Nontoxic single thyroid nodule: Secondary | ICD-10-CM | POA: Diagnosis not present

## 2018-05-25 DIAGNOSIS — R7303 Prediabetes: Secondary | ICD-10-CM | POA: Diagnosis not present

## 2018-05-25 DIAGNOSIS — E559 Vitamin D deficiency, unspecified: Secondary | ICD-10-CM | POA: Diagnosis not present

## 2018-05-25 DIAGNOSIS — Z720 Tobacco use: Secondary | ICD-10-CM | POA: Diagnosis not present

## 2018-05-25 DIAGNOSIS — E782 Mixed hyperlipidemia: Secondary | ICD-10-CM | POA: Diagnosis not present

## 2018-05-29 DIAGNOSIS — E782 Mixed hyperlipidemia: Secondary | ICD-10-CM | POA: Diagnosis not present

## 2018-05-29 DIAGNOSIS — Z720 Tobacco use: Secondary | ICD-10-CM | POA: Diagnosis not present

## 2018-05-29 DIAGNOSIS — Z6824 Body mass index (BMI) 24.0-24.9, adult: Secondary | ICD-10-CM | POA: Diagnosis not present

## 2018-05-29 DIAGNOSIS — R7303 Prediabetes: Secondary | ICD-10-CM | POA: Diagnosis not present

## 2018-05-29 DIAGNOSIS — Z23 Encounter for immunization: Secondary | ICD-10-CM | POA: Diagnosis not present

## 2018-05-29 DIAGNOSIS — K5900 Constipation, unspecified: Secondary | ICD-10-CM | POA: Diagnosis not present

## 2018-05-29 DIAGNOSIS — M19012 Primary osteoarthritis, left shoulder: Secondary | ICD-10-CM | POA: Diagnosis not present

## 2018-05-29 DIAGNOSIS — L408 Other psoriasis: Secondary | ICD-10-CM | POA: Diagnosis not present

## 2018-07-07 DIAGNOSIS — Z720 Tobacco use: Secondary | ICD-10-CM | POA: Diagnosis not present

## 2018-07-07 DIAGNOSIS — J209 Acute bronchitis, unspecified: Secondary | ICD-10-CM | POA: Diagnosis not present

## 2018-07-07 DIAGNOSIS — Z6824 Body mass index (BMI) 24.0-24.9, adult: Secondary | ICD-10-CM | POA: Diagnosis not present

## 2018-08-15 DIAGNOSIS — Z85828 Personal history of other malignant neoplasm of skin: Secondary | ICD-10-CM | POA: Diagnosis not present

## 2018-08-15 DIAGNOSIS — D485 Neoplasm of uncertain behavior of skin: Secondary | ICD-10-CM | POA: Diagnosis not present

## 2018-08-15 DIAGNOSIS — L409 Psoriasis, unspecified: Secondary | ICD-10-CM | POA: Diagnosis not present

## 2018-08-15 DIAGNOSIS — L570 Actinic keratosis: Secondary | ICD-10-CM | POA: Diagnosis not present

## 2018-12-04 DIAGNOSIS — K5900 Constipation, unspecified: Secondary | ICD-10-CM | POA: Diagnosis not present

## 2018-12-04 DIAGNOSIS — E782 Mixed hyperlipidemia: Secondary | ICD-10-CM | POA: Diagnosis not present

## 2018-12-04 DIAGNOSIS — R7303 Prediabetes: Secondary | ICD-10-CM | POA: Diagnosis not present

## 2018-12-04 DIAGNOSIS — Z720 Tobacco use: Secondary | ICD-10-CM | POA: Diagnosis not present

## 2018-12-04 DIAGNOSIS — E041 Nontoxic single thyroid nodule: Secondary | ICD-10-CM | POA: Diagnosis not present

## 2018-12-04 DIAGNOSIS — M19012 Primary osteoarthritis, left shoulder: Secondary | ICD-10-CM | POA: Diagnosis not present

## 2018-12-06 DIAGNOSIS — L408 Other psoriasis: Secondary | ICD-10-CM | POA: Diagnosis not present

## 2018-12-06 DIAGNOSIS — M1991 Primary osteoarthritis, unspecified site: Secondary | ICD-10-CM | POA: Diagnosis not present

## 2018-12-06 DIAGNOSIS — M25552 Pain in left hip: Secondary | ICD-10-CM | POA: Diagnosis not present

## 2018-12-06 DIAGNOSIS — I493 Ventricular premature depolarization: Secondary | ICD-10-CM | POA: Diagnosis not present

## 2018-12-06 DIAGNOSIS — L4050 Arthropathic psoriasis, unspecified: Secondary | ICD-10-CM | POA: Diagnosis not present

## 2018-12-06 DIAGNOSIS — Z789 Other specified health status: Secondary | ICD-10-CM | POA: Diagnosis not present

## 2018-12-06 DIAGNOSIS — Z0001 Encounter for general adult medical examination with abnormal findings: Secondary | ICD-10-CM | POA: Diagnosis not present

## 2018-12-06 DIAGNOSIS — R7303 Prediabetes: Secondary | ICD-10-CM | POA: Diagnosis not present

## 2019-03-06 DIAGNOSIS — R7303 Prediabetes: Secondary | ICD-10-CM | POA: Diagnosis not present

## 2019-03-06 DIAGNOSIS — E782 Mixed hyperlipidemia: Secondary | ICD-10-CM | POA: Diagnosis not present

## 2019-03-06 DIAGNOSIS — Z209 Contact with and (suspected) exposure to unspecified communicable disease: Secondary | ICD-10-CM | POA: Diagnosis not present

## 2019-03-06 DIAGNOSIS — G4700 Insomnia, unspecified: Secondary | ICD-10-CM | POA: Diagnosis not present

## 2019-03-06 DIAGNOSIS — Z6824 Body mass index (BMI) 24.0-24.9, adult: Secondary | ICD-10-CM | POA: Diagnosis not present

## 2019-03-06 DIAGNOSIS — Z720 Tobacco use: Secondary | ICD-10-CM | POA: Diagnosis not present

## 2019-03-06 DIAGNOSIS — L4050 Arthropathic psoriasis, unspecified: Secondary | ICD-10-CM | POA: Diagnosis not present

## 2019-05-09 DIAGNOSIS — I10 Essential (primary) hypertension: Secondary | ICD-10-CM | POA: Diagnosis not present

## 2019-05-09 DIAGNOSIS — E782 Mixed hyperlipidemia: Secondary | ICD-10-CM | POA: Diagnosis not present

## 2019-05-09 DIAGNOSIS — M1991 Primary osteoarthritis, unspecified site: Secondary | ICD-10-CM | POA: Diagnosis not present

## 2019-05-22 DIAGNOSIS — H43393 Other vitreous opacities, bilateral: Secondary | ICD-10-CM | POA: Diagnosis not present

## 2019-05-22 DIAGNOSIS — Z0100 Encounter for examination of eyes and vision without abnormal findings: Secondary | ICD-10-CM | POA: Diagnosis not present

## 2019-05-22 DIAGNOSIS — H524 Presbyopia: Secondary | ICD-10-CM | POA: Diagnosis not present

## 2019-05-24 DIAGNOSIS — E041 Nontoxic single thyroid nodule: Secondary | ICD-10-CM | POA: Diagnosis not present

## 2019-05-24 DIAGNOSIS — E782 Mixed hyperlipidemia: Secondary | ICD-10-CM | POA: Diagnosis not present

## 2019-05-24 DIAGNOSIS — E559 Vitamin D deficiency, unspecified: Secondary | ICD-10-CM | POA: Diagnosis not present

## 2019-05-24 DIAGNOSIS — R7303 Prediabetes: Secondary | ICD-10-CM | POA: Diagnosis not present

## 2019-05-29 DIAGNOSIS — E782 Mixed hyperlipidemia: Secondary | ICD-10-CM | POA: Diagnosis not present

## 2019-05-29 DIAGNOSIS — Z23 Encounter for immunization: Secondary | ICD-10-CM | POA: Diagnosis not present

## 2019-05-29 DIAGNOSIS — Z720 Tobacco use: Secondary | ICD-10-CM | POA: Diagnosis not present

## 2019-05-29 DIAGNOSIS — R7303 Prediabetes: Secondary | ICD-10-CM | POA: Diagnosis not present

## 2019-05-29 DIAGNOSIS — G4700 Insomnia, unspecified: Secondary | ICD-10-CM | POA: Diagnosis not present

## 2019-05-29 DIAGNOSIS — M1991 Primary osteoarthritis, unspecified site: Secondary | ICD-10-CM | POA: Diagnosis not present

## 2019-05-29 DIAGNOSIS — Z6824 Body mass index (BMI) 24.0-24.9, adult: Secondary | ICD-10-CM | POA: Diagnosis not present

## 2019-05-29 DIAGNOSIS — L4050 Arthropathic psoriasis, unspecified: Secondary | ICD-10-CM | POA: Diagnosis not present

## 2019-07-19 DIAGNOSIS — Z0100 Encounter for examination of eyes and vision without abnormal findings: Secondary | ICD-10-CM | POA: Diagnosis not present

## 2019-08-09 DIAGNOSIS — I10 Essential (primary) hypertension: Secondary | ICD-10-CM | POA: Diagnosis not present

## 2019-08-09 DIAGNOSIS — E782 Mixed hyperlipidemia: Secondary | ICD-10-CM | POA: Diagnosis not present

## 2019-11-07 DIAGNOSIS — R7303 Prediabetes: Secondary | ICD-10-CM | POA: Diagnosis not present

## 2019-11-07 DIAGNOSIS — E7849 Other hyperlipidemia: Secondary | ICD-10-CM | POA: Diagnosis not present

## 2019-11-26 DIAGNOSIS — R7303 Prediabetes: Secondary | ICD-10-CM | POA: Diagnosis not present

## 2019-11-26 DIAGNOSIS — E559 Vitamin D deficiency, unspecified: Secondary | ICD-10-CM | POA: Diagnosis not present

## 2019-11-26 DIAGNOSIS — E782 Mixed hyperlipidemia: Secondary | ICD-10-CM | POA: Diagnosis not present

## 2019-11-26 DIAGNOSIS — E041 Nontoxic single thyroid nodule: Secondary | ICD-10-CM | POA: Diagnosis not present

## 2019-11-26 DIAGNOSIS — Z720 Tobacco use: Secondary | ICD-10-CM | POA: Diagnosis not present

## 2019-11-28 DIAGNOSIS — L4050 Arthropathic psoriasis, unspecified: Secondary | ICD-10-CM | POA: Diagnosis not present

## 2019-11-28 DIAGNOSIS — G4700 Insomnia, unspecified: Secondary | ICD-10-CM | POA: Diagnosis not present

## 2019-11-28 DIAGNOSIS — R7303 Prediabetes: Secondary | ICD-10-CM | POA: Diagnosis not present

## 2019-11-28 DIAGNOSIS — Z720 Tobacco use: Secondary | ICD-10-CM | POA: Diagnosis not present

## 2019-11-28 DIAGNOSIS — Z6825 Body mass index (BMI) 25.0-25.9, adult: Secondary | ICD-10-CM | POA: Diagnosis not present

## 2019-11-28 DIAGNOSIS — E782 Mixed hyperlipidemia: Secondary | ICD-10-CM | POA: Diagnosis not present

## 2019-11-28 DIAGNOSIS — M1991 Primary osteoarthritis, unspecified site: Secondary | ICD-10-CM | POA: Diagnosis not present

## 2019-11-28 DIAGNOSIS — Z0001 Encounter for general adult medical examination with abnormal findings: Secondary | ICD-10-CM | POA: Diagnosis not present

## 2019-11-29 DIAGNOSIS — Z23 Encounter for immunization: Secondary | ICD-10-CM | POA: Diagnosis not present

## 2019-12-27 DIAGNOSIS — Z23 Encounter for immunization: Secondary | ICD-10-CM | POA: Diagnosis not present

## 2020-04-08 DIAGNOSIS — E7849 Other hyperlipidemia: Secondary | ICD-10-CM | POA: Diagnosis not present

## 2020-04-08 DIAGNOSIS — R7303 Prediabetes: Secondary | ICD-10-CM | POA: Diagnosis not present

## 2020-04-08 DIAGNOSIS — M19012 Primary osteoarthritis, left shoulder: Secondary | ICD-10-CM | POA: Diagnosis not present

## 2020-05-08 DIAGNOSIS — R7303 Prediabetes: Secondary | ICD-10-CM | POA: Diagnosis not present

## 2020-05-08 DIAGNOSIS — E7849 Other hyperlipidemia: Secondary | ICD-10-CM | POA: Diagnosis not present

## 2020-05-08 DIAGNOSIS — M19012 Primary osteoarthritis, left shoulder: Secondary | ICD-10-CM | POA: Diagnosis not present

## 2020-05-27 DIAGNOSIS — E559 Vitamin D deficiency, unspecified: Secondary | ICD-10-CM | POA: Diagnosis not present

## 2020-05-27 DIAGNOSIS — R7303 Prediabetes: Secondary | ICD-10-CM | POA: Diagnosis not present

## 2020-05-27 DIAGNOSIS — E041 Nontoxic single thyroid nodule: Secondary | ICD-10-CM | POA: Diagnosis not present

## 2020-05-27 DIAGNOSIS — E782 Mixed hyperlipidemia: Secondary | ICD-10-CM | POA: Diagnosis not present

## 2020-05-29 DIAGNOSIS — L4050 Arthropathic psoriasis, unspecified: Secondary | ICD-10-CM | POA: Diagnosis not present

## 2020-05-29 DIAGNOSIS — Z720 Tobacco use: Secondary | ICD-10-CM | POA: Diagnosis not present

## 2020-05-29 DIAGNOSIS — E559 Vitamin D deficiency, unspecified: Secondary | ICD-10-CM | POA: Diagnosis not present

## 2020-05-29 DIAGNOSIS — Z23 Encounter for immunization: Secondary | ICD-10-CM | POA: Diagnosis not present

## 2020-05-29 DIAGNOSIS — R7303 Prediabetes: Secondary | ICD-10-CM | POA: Diagnosis not present

## 2020-05-29 DIAGNOSIS — E782 Mixed hyperlipidemia: Secondary | ICD-10-CM | POA: Diagnosis not present

## 2020-05-29 DIAGNOSIS — Z0001 Encounter for general adult medical examination with abnormal findings: Secondary | ICD-10-CM | POA: Diagnosis not present

## 2020-05-29 DIAGNOSIS — Z6824 Body mass index (BMI) 24.0-24.9, adult: Secondary | ICD-10-CM | POA: Diagnosis not present

## 2020-08-18 DIAGNOSIS — J439 Emphysema, unspecified: Secondary | ICD-10-CM | POA: Diagnosis not present

## 2020-08-18 DIAGNOSIS — R69 Illness, unspecified: Secondary | ICD-10-CM | POA: Diagnosis not present

## 2020-08-18 DIAGNOSIS — I700 Atherosclerosis of aorta: Secondary | ICD-10-CM | POA: Diagnosis not present

## 2020-08-18 DIAGNOSIS — I2510 Atherosclerotic heart disease of native coronary artery without angina pectoris: Secondary | ICD-10-CM | POA: Diagnosis not present

## 2020-10-06 DIAGNOSIS — E7849 Other hyperlipidemia: Secondary | ICD-10-CM | POA: Diagnosis not present

## 2020-10-06 DIAGNOSIS — M19012 Primary osteoarthritis, left shoulder: Secondary | ICD-10-CM | POA: Diagnosis not present

## 2020-10-06 DIAGNOSIS — R7303 Prediabetes: Secondary | ICD-10-CM | POA: Diagnosis not present

## 2020-10-13 DIAGNOSIS — L570 Actinic keratosis: Secondary | ICD-10-CM | POA: Diagnosis not present

## 2020-10-13 DIAGNOSIS — C4442 Squamous cell carcinoma of skin of scalp and neck: Secondary | ICD-10-CM | POA: Diagnosis not present

## 2020-10-13 DIAGNOSIS — D485 Neoplasm of uncertain behavior of skin: Secondary | ICD-10-CM | POA: Diagnosis not present

## 2020-10-23 DIAGNOSIS — C4442 Squamous cell carcinoma of skin of scalp and neck: Secondary | ICD-10-CM | POA: Diagnosis not present

## 2020-11-05 DIAGNOSIS — M19012 Primary osteoarthritis, left shoulder: Secondary | ICD-10-CM | POA: Diagnosis not present

## 2020-11-05 DIAGNOSIS — R7303 Prediabetes: Secondary | ICD-10-CM | POA: Diagnosis not present

## 2020-11-05 DIAGNOSIS — E7849 Other hyperlipidemia: Secondary | ICD-10-CM | POA: Diagnosis not present

## 2020-11-06 DIAGNOSIS — S39012A Strain of muscle, fascia and tendon of lower back, initial encounter: Secondary | ICD-10-CM | POA: Diagnosis not present

## 2020-11-06 DIAGNOSIS — L4050 Arthropathic psoriasis, unspecified: Secondary | ICD-10-CM | POA: Diagnosis not present

## 2020-11-06 DIAGNOSIS — R202 Paresthesia of skin: Secondary | ICD-10-CM | POA: Diagnosis not present

## 2020-11-06 DIAGNOSIS — R7303 Prediabetes: Secondary | ICD-10-CM | POA: Diagnosis not present

## 2020-11-06 DIAGNOSIS — Z720 Tobacco use: Secondary | ICD-10-CM | POA: Diagnosis not present

## 2020-11-14 ENCOUNTER — Other Ambulatory Visit: Payer: Self-pay | Admitting: Physician Assistant

## 2020-11-14 ENCOUNTER — Other Ambulatory Visit (HOSPITAL_COMMUNITY): Payer: Self-pay | Admitting: Physician Assistant

## 2020-11-14 DIAGNOSIS — M545 Low back pain, unspecified: Secondary | ICD-10-CM

## 2020-11-18 DIAGNOSIS — Z0389 Encounter for observation for other suspected diseases and conditions ruled out: Secondary | ICD-10-CM | POA: Diagnosis not present

## 2020-11-18 DIAGNOSIS — Z720 Tobacco use: Secondary | ICD-10-CM | POA: Diagnosis not present

## 2020-11-18 DIAGNOSIS — R7303 Prediabetes: Secondary | ICD-10-CM | POA: Diagnosis not present

## 2020-11-19 DIAGNOSIS — R7303 Prediabetes: Secondary | ICD-10-CM | POA: Diagnosis not present

## 2020-11-19 DIAGNOSIS — E041 Nontoxic single thyroid nodule: Secondary | ICD-10-CM | POA: Diagnosis not present

## 2020-11-19 DIAGNOSIS — E7849 Other hyperlipidemia: Secondary | ICD-10-CM | POA: Diagnosis not present

## 2020-11-19 DIAGNOSIS — E782 Mixed hyperlipidemia: Secondary | ICD-10-CM | POA: Diagnosis not present

## 2020-11-26 DIAGNOSIS — I700 Atherosclerosis of aorta: Secondary | ICD-10-CM | POA: Diagnosis not present

## 2020-11-26 DIAGNOSIS — R202 Paresthesia of skin: Secondary | ICD-10-CM | POA: Diagnosis not present

## 2020-11-26 DIAGNOSIS — L4050 Arthropathic psoriasis, unspecified: Secondary | ICD-10-CM | POA: Diagnosis not present

## 2020-11-26 DIAGNOSIS — R7303 Prediabetes: Secondary | ICD-10-CM | POA: Diagnosis not present

## 2020-11-26 DIAGNOSIS — J439 Emphysema, unspecified: Secondary | ICD-10-CM | POA: Diagnosis not present

## 2020-11-26 DIAGNOSIS — M5416 Radiculopathy, lumbar region: Secondary | ICD-10-CM | POA: Diagnosis not present

## 2020-11-26 DIAGNOSIS — Z720 Tobacco use: Secondary | ICD-10-CM | POA: Diagnosis not present

## 2020-11-26 DIAGNOSIS — E7849 Other hyperlipidemia: Secondary | ICD-10-CM | POA: Diagnosis not present

## 2020-11-28 ENCOUNTER — Other Ambulatory Visit: Payer: Self-pay

## 2020-11-28 ENCOUNTER — Ambulatory Visit (HOSPITAL_COMMUNITY)
Admission: RE | Admit: 2020-11-28 | Discharge: 2020-11-28 | Disposition: A | Discharge status: Home / Self Care | Payer: Medicare HMO | Source: Ambulatory Visit | Attending: Physician Assistant | Admitting: Physician Assistant

## 2020-11-28 DIAGNOSIS — M545 Low back pain, unspecified: Secondary | ICD-10-CM | POA: Insufficient documentation

## 2020-12-11 DIAGNOSIS — M5416 Radiculopathy, lumbar region: Secondary | ICD-10-CM | POA: Diagnosis not present

## 2021-02-05 DIAGNOSIS — R7303 Prediabetes: Secondary | ICD-10-CM | POA: Diagnosis not present

## 2021-02-05 DIAGNOSIS — M19012 Primary osteoarthritis, left shoulder: Secondary | ICD-10-CM | POA: Diagnosis not present

## 2021-02-05 DIAGNOSIS — E7849 Other hyperlipidemia: Secondary | ICD-10-CM | POA: Diagnosis not present

## 2021-02-16 DIAGNOSIS — M9901 Segmental and somatic dysfunction of cervical region: Secondary | ICD-10-CM | POA: Diagnosis not present

## 2021-02-16 DIAGNOSIS — M9902 Segmental and somatic dysfunction of thoracic region: Secondary | ICD-10-CM | POA: Diagnosis not present

## 2021-02-16 DIAGNOSIS — S233XXA Sprain of ligaments of thoracic spine, initial encounter: Secondary | ICD-10-CM | POA: Diagnosis not present

## 2021-02-16 DIAGNOSIS — M47816 Spondylosis without myelopathy or radiculopathy, lumbar region: Secondary | ICD-10-CM | POA: Diagnosis not present

## 2021-02-16 DIAGNOSIS — M9903 Segmental and somatic dysfunction of lumbar region: Secondary | ICD-10-CM | POA: Diagnosis not present

## 2021-02-16 DIAGNOSIS — M47812 Spondylosis without myelopathy or radiculopathy, cervical region: Secondary | ICD-10-CM | POA: Diagnosis not present

## 2021-02-19 DIAGNOSIS — M9903 Segmental and somatic dysfunction of lumbar region: Secondary | ICD-10-CM | POA: Diagnosis not present

## 2021-02-19 DIAGNOSIS — M47816 Spondylosis without myelopathy or radiculopathy, lumbar region: Secondary | ICD-10-CM | POA: Diagnosis not present

## 2021-02-19 DIAGNOSIS — M9902 Segmental and somatic dysfunction of thoracic region: Secondary | ICD-10-CM | POA: Diagnosis not present

## 2021-02-19 DIAGNOSIS — M9901 Segmental and somatic dysfunction of cervical region: Secondary | ICD-10-CM | POA: Diagnosis not present

## 2021-02-19 DIAGNOSIS — S233XXA Sprain of ligaments of thoracic spine, initial encounter: Secondary | ICD-10-CM | POA: Diagnosis not present

## 2021-02-19 DIAGNOSIS — M47812 Spondylosis without myelopathy or radiculopathy, cervical region: Secondary | ICD-10-CM | POA: Diagnosis not present

## 2021-02-23 DIAGNOSIS — M47816 Spondylosis without myelopathy or radiculopathy, lumbar region: Secondary | ICD-10-CM | POA: Diagnosis not present

## 2021-02-23 DIAGNOSIS — M9902 Segmental and somatic dysfunction of thoracic region: Secondary | ICD-10-CM | POA: Diagnosis not present

## 2021-02-23 DIAGNOSIS — M47812 Spondylosis without myelopathy or radiculopathy, cervical region: Secondary | ICD-10-CM | POA: Diagnosis not present

## 2021-02-23 DIAGNOSIS — S233XXA Sprain of ligaments of thoracic spine, initial encounter: Secondary | ICD-10-CM | POA: Diagnosis not present

## 2021-02-23 DIAGNOSIS — M9901 Segmental and somatic dysfunction of cervical region: Secondary | ICD-10-CM | POA: Diagnosis not present

## 2021-02-23 DIAGNOSIS — M9903 Segmental and somatic dysfunction of lumbar region: Secondary | ICD-10-CM | POA: Diagnosis not present

## 2021-03-02 DIAGNOSIS — S233XXA Sprain of ligaments of thoracic spine, initial encounter: Secondary | ICD-10-CM | POA: Diagnosis not present

## 2021-03-02 DIAGNOSIS — M9901 Segmental and somatic dysfunction of cervical region: Secondary | ICD-10-CM | POA: Diagnosis not present

## 2021-03-02 DIAGNOSIS — M47816 Spondylosis without myelopathy or radiculopathy, lumbar region: Secondary | ICD-10-CM | POA: Diagnosis not present

## 2021-03-02 DIAGNOSIS — M9902 Segmental and somatic dysfunction of thoracic region: Secondary | ICD-10-CM | POA: Diagnosis not present

## 2021-03-02 DIAGNOSIS — M47812 Spondylosis without myelopathy or radiculopathy, cervical region: Secondary | ICD-10-CM | POA: Diagnosis not present

## 2021-03-02 DIAGNOSIS — M9903 Segmental and somatic dysfunction of lumbar region: Secondary | ICD-10-CM | POA: Diagnosis not present

## 2021-03-16 DIAGNOSIS — M9903 Segmental and somatic dysfunction of lumbar region: Secondary | ICD-10-CM | POA: Diagnosis not present

## 2021-03-16 DIAGNOSIS — M9901 Segmental and somatic dysfunction of cervical region: Secondary | ICD-10-CM | POA: Diagnosis not present

## 2021-03-16 DIAGNOSIS — M47816 Spondylosis without myelopathy or radiculopathy, lumbar region: Secondary | ICD-10-CM | POA: Diagnosis not present

## 2021-03-16 DIAGNOSIS — M9902 Segmental and somatic dysfunction of thoracic region: Secondary | ICD-10-CM | POA: Diagnosis not present

## 2021-03-16 DIAGNOSIS — M47812 Spondylosis without myelopathy or radiculopathy, cervical region: Secondary | ICD-10-CM | POA: Diagnosis not present

## 2021-03-16 DIAGNOSIS — S233XXA Sprain of ligaments of thoracic spine, initial encounter: Secondary | ICD-10-CM | POA: Diagnosis not present

## 2021-04-06 DIAGNOSIS — M47816 Spondylosis without myelopathy or radiculopathy, lumbar region: Secondary | ICD-10-CM | POA: Diagnosis not present

## 2021-04-06 DIAGNOSIS — M9903 Segmental and somatic dysfunction of lumbar region: Secondary | ICD-10-CM | POA: Diagnosis not present

## 2021-04-06 DIAGNOSIS — M9901 Segmental and somatic dysfunction of cervical region: Secondary | ICD-10-CM | POA: Diagnosis not present

## 2021-04-06 DIAGNOSIS — M47812 Spondylosis without myelopathy or radiculopathy, cervical region: Secondary | ICD-10-CM | POA: Diagnosis not present

## 2021-04-06 DIAGNOSIS — S233XXA Sprain of ligaments of thoracic spine, initial encounter: Secondary | ICD-10-CM | POA: Diagnosis not present

## 2021-04-06 DIAGNOSIS — M9902 Segmental and somatic dysfunction of thoracic region: Secondary | ICD-10-CM | POA: Diagnosis not present

## 2021-04-08 DIAGNOSIS — M19012 Primary osteoarthritis, left shoulder: Secondary | ICD-10-CM | POA: Diagnosis not present

## 2021-04-08 DIAGNOSIS — E7849 Other hyperlipidemia: Secondary | ICD-10-CM | POA: Diagnosis not present

## 2021-04-08 DIAGNOSIS — R7303 Prediabetes: Secondary | ICD-10-CM | POA: Diagnosis not present

## 2021-04-27 DIAGNOSIS — M9903 Segmental and somatic dysfunction of lumbar region: Secondary | ICD-10-CM | POA: Diagnosis not present

## 2021-04-27 DIAGNOSIS — M47816 Spondylosis without myelopathy or radiculopathy, lumbar region: Secondary | ICD-10-CM | POA: Diagnosis not present

## 2021-04-27 DIAGNOSIS — M47812 Spondylosis without myelopathy or radiculopathy, cervical region: Secondary | ICD-10-CM | POA: Diagnosis not present

## 2021-04-27 DIAGNOSIS — S233XXA Sprain of ligaments of thoracic spine, initial encounter: Secondary | ICD-10-CM | POA: Diagnosis not present

## 2021-04-27 DIAGNOSIS — M9902 Segmental and somatic dysfunction of thoracic region: Secondary | ICD-10-CM | POA: Diagnosis not present

## 2021-04-27 DIAGNOSIS — M9901 Segmental and somatic dysfunction of cervical region: Secondary | ICD-10-CM | POA: Diagnosis not present

## 2021-05-21 DIAGNOSIS — E7849 Other hyperlipidemia: Secondary | ICD-10-CM | POA: Diagnosis not present

## 2021-05-21 DIAGNOSIS — J439 Emphysema, unspecified: Secondary | ICD-10-CM | POA: Diagnosis not present

## 2021-05-21 DIAGNOSIS — E782 Mixed hyperlipidemia: Secondary | ICD-10-CM | POA: Diagnosis not present

## 2021-05-21 DIAGNOSIS — R7303 Prediabetes: Secondary | ICD-10-CM | POA: Diagnosis not present

## 2021-05-25 DIAGNOSIS — M9903 Segmental and somatic dysfunction of lumbar region: Secondary | ICD-10-CM | POA: Diagnosis not present

## 2021-05-25 DIAGNOSIS — L4050 Arthropathic psoriasis, unspecified: Secondary | ICD-10-CM | POA: Diagnosis not present

## 2021-05-25 DIAGNOSIS — S233XXA Sprain of ligaments of thoracic spine, initial encounter: Secondary | ICD-10-CM | POA: Diagnosis not present

## 2021-05-25 DIAGNOSIS — R7303 Prediabetes: Secondary | ICD-10-CM | POA: Diagnosis not present

## 2021-05-25 DIAGNOSIS — Z720 Tobacco use: Secondary | ICD-10-CM | POA: Diagnosis not present

## 2021-05-25 DIAGNOSIS — Z0001 Encounter for general adult medical examination with abnormal findings: Secondary | ICD-10-CM | POA: Diagnosis not present

## 2021-05-25 DIAGNOSIS — M47812 Spondylosis without myelopathy or radiculopathy, cervical region: Secondary | ICD-10-CM | POA: Diagnosis not present

## 2021-05-25 DIAGNOSIS — J439 Emphysema, unspecified: Secondary | ICD-10-CM | POA: Diagnosis not present

## 2021-05-25 DIAGNOSIS — M9901 Segmental and somatic dysfunction of cervical region: Secondary | ICD-10-CM | POA: Diagnosis not present

## 2021-05-25 DIAGNOSIS — M47816 Spondylosis without myelopathy or radiculopathy, lumbar region: Secondary | ICD-10-CM | POA: Diagnosis not present

## 2021-05-25 DIAGNOSIS — I700 Atherosclerosis of aorta: Secondary | ICD-10-CM | POA: Diagnosis not present

## 2021-05-25 DIAGNOSIS — R0789 Other chest pain: Secondary | ICD-10-CM | POA: Diagnosis not present

## 2021-05-25 DIAGNOSIS — M9902 Segmental and somatic dysfunction of thoracic region: Secondary | ICD-10-CM | POA: Diagnosis not present

## 2021-05-25 DIAGNOSIS — Z23 Encounter for immunization: Secondary | ICD-10-CM | POA: Diagnosis not present

## 2021-06-16 DIAGNOSIS — R0789 Other chest pain: Secondary | ICD-10-CM | POA: Diagnosis not present

## 2021-06-16 DIAGNOSIS — I517 Cardiomegaly: Secondary | ICD-10-CM | POA: Diagnosis not present

## 2021-06-16 DIAGNOSIS — R079 Chest pain, unspecified: Secondary | ICD-10-CM | POA: Diagnosis not present

## 2021-06-16 DIAGNOSIS — R9439 Abnormal result of other cardiovascular function study: Secondary | ICD-10-CM | POA: Diagnosis not present

## 2021-06-18 NOTE — H&P (View-Only) (Signed)
Cardiology Office Note:    Date:  06/19/2021   ID:  Dale Elliott, DOB [birth year], MRN [id number]  PCP:  Curlene Labrum, MD  Cardiologist:  Early Osmond, MD  Electrophysiologist:  None   Referring MD: Manon Hilding, MD   Chief Complaint/Reason for Referral: Abnormal stress test  History of Present Illness:    PROBLEM LIST: 1.  Hyperlipidemia 2.  Arthritis 3.  Tobacco abuse 4.  COPD  Dale Elliott is a 75 y.o. male with the indicated history the patient had been seen by his primary care provider recently.  He was complaining of chest pressure that happens every few weeks not associated with exertion or shortness of breath.  He was referred for stress test which demonstrated high risk findings with a large anteroseptal area of ischemia.  An ejection fraction or wall motion abnormalities could not be assessed.  The patient tells me that he occasionally gets chest tightness but he cannot specify.  He tells me its not that bad and does not occur that often.  He does wheeze on occasion due to COPD.  He has had no bad bleeding or bruising episodes.  He has never had a stroke.  He has some shortness of breath when he lays flat.  He denies any peripheral edema.  He has required no hospitalizations or emergency room visits.  Past Medical History:  Diagnosis Date   Arthritis     Past Surgical History:  Procedure Laterality Date   BACK SURGERY     1988 1990   BIOPSY THYROID Left 2012   CATARACT EXTRACTION W/PHACO Left 04/22/2014   Procedure: CATARACT EXTRACTION PHACO AND INTRAOCULAR LENS PLACEMENT ([place]);  Surgeon: Tonny Branch, MD;  Location: [facility];  Service: Ophthalmology;  Laterality: Left;  CDE:  9.68   CATARACT EXTRACTION W/PHACO Right 05/09/2014   Procedure: CATARACT EXTRACTION PHACO AND INTRAOCULAR LENS PLACEMENT RIGHT EYE CDE=15.76;  Surgeon: Tonny Branch, MD;  Location: [facility];  Service: Ophthalmology;  Laterality: Right;    Current Medications: Current Meds   Medication Sig   amitriptyline (ELAVIL) 25 MG tablet Take 25 mg by mouth at bedtime.   amLODipine (NORVASC) 10 MG tablet Take 1 tablet (10 mg total) by mouth daily.   aspirin 325 MG tablet Take 81.25 mg by mouth daily.   nitroGLYCERIN (NITROSTAT) 0.4 MG SL tablet Place 1 tablet (0.4 mg total) under the tongue every 5 (five) minutes as needed for chest pain.   simvastatin (ZOCOR) 20 MG tablet Take 20 mg by mouth daily.   [DISCONTINUED] BESIVANCE 0.6 % SUSP    [DISCONTINUED] DUREZOL 0.05 % EMUL      Allergies:   Patient has no known allergies.   Social History   Tobacco Use   Smoking status: Every Day    Packs/day: 1.00    Years: 55.00    Pack years: 55.00    Types: Cigarettes   Smokeless tobacco: Never  Substance Use Topics   Alcohol use: No   Drug use: No     Family History: The patient's family history includes Stroke in his father.  ROS:   Please see the history of present illness.    All other systems reviewed and are negative.  EKGs/Labs/Other Studies Reviewed:    The following studies were reviewed today:  EKG: Sinus rhythm with PACs  Imaging studies that I have independently reviewed today: Stress test as detailed above, labs demonstrate a TSH of 3.8 hemoglobin 15.9, platelets 220, labs from October  2022 demonstrated BUN of 14 creatinine 0.9 sodium 140 potassium 4.3 LFTs within normal limits  TTE 11/22 (Care Everywhere)  1. The left ventricle is normal in size with normal wall thickness.    2. The left ventricular systolic function is normal, LVEF is visually  estimated at > 55%.    3. The left atrium is mildly dilated in size.    4. The right ventricle is normal in size, with normal systolic function.   Recent Labs: No results found for requested labs within last 8760 hours.  Recent Lipid Panel No results found for: CHOL, TRIG, HDL, CHOLHDL, VLDL, LDLCALC, LDLDIRECT  Lipid panel from October 2022 demonstrates a cholesterol of 129, HDL 51, triglycerides  88, and LDL 58; creatinine was 0.9  Physical Exam:    VS:  BP (!) 160/64   Pulse 79   Ht 5\' 7"  (1.702 m)   Wt 155 lb (70.3 kg)   SpO2 98%   BMI 24.28 kg/m     Wt Readings from Last 5 Encounters:  06/19/21 155 lb (70.3 kg)  04/17/14 145 lb (65.8 kg)    GENERAL:  No apparent distress, AOx3 HEENT:  No carotid bruits, +2 carotid impulses, no scleral icterus CAR: RRR no murmurs, gallops, rubs, or thrills RES:  Clear to auscultation bilaterally ABD:  Soft, nontender, nondistended, positive bowel sounds x 4 VASC:  +2 radial pulses, +2 carotid pulses, palpable pedal pulses NEURO:  CN 2-12 grossly intact; motor and sensory grossly intact PSYCH:  No active depression or anxiety EXT:  No edema  ASSESSMENT:    1. Abnormal nuclear stress test   2. Hyperlipidemia, unspecified hyperlipidemia type   3. Hypertension, unspecified type   4. Pre-procedural laboratory examination    PLAN:    Abnormal nuclear stress test Given the high risk stress test we will proceed to cardiac catheterization.  His ejection fraction on recent TTE is normal.  We will prescribe the patient as needed nitroglycerin.  Given his COPD I will refrain from beta-blockade.  Further recommendations will follow results of this test.  I will have him follow-up in 3 months time or earlier if needed but this will be informed of the results of the cardiac catheterization  Hyperlipidemia, unspecified hyperlipidemia type Continue simvastatin we will check a lipid panel and CMP in the future.  Hypertension, unspecified type We will start amlodipine 10 mg daily.   Shared Decision Making/Informed Consent:   Shared Decision Making/Informed Consent The risks [stroke (1 in 1000), death (1 in 1000), kidney failure [usually temporary] (1 in 500), bleeding (1 in 200), allergic reaction [possibly serious] (1 in 200)], benefits (diagnostic support and management of coronary artery disease) and alternatives of a cardiac catheterization  were discussed in detail with Dale Elliott and he is willing to proceed.   Medication Adjustments/Labs and Tests Ordered: Current medicines are reviewed at length with the patient today.  Concerns regarding medicines are outlined above.   Orders Placed This Encounter  Procedures   Basic Metabolic Panel (BMET)   CBC   EKG 12-Lead   ECHOCARDIOGRAM COMPLETE     Meds ordered this encounter  Medications   amLODipine (NORVASC) 10 MG tablet    Sig: Take 1 tablet (10 mg total) by mouth daily.    Dispense:  90 tablet    Refill:  3   nitroGLYCERIN (NITROSTAT) 0.4 MG SL tablet    Sig: Place 1 tablet (0.4 mg total) under the tongue every 5 (five) minutes as needed for chest  pain.    Dispense:  90 tablet    Refill:  3     Patient Instructions  Medication Instructions:  Your physician has recommended you make the following change in your medication:  1.) start amlodipine 10 mg - one tablet daily 2.) nitroglycerin 0.4 mg - use as directed  *If you need a refill on your cardiac medications before your next appointment, please call your pharmacy*   Lab Work: Today: BMET, CBC  If you have labs (blood work) drawn today and your tests are completely normal, you will receive your results only by: [place] (if you have MyChart) OR A paper copy in the mail If you have any lab test that is abnormal or we need to change your treatment, we will call you to review the results.   Testing/Procedures: Your physician has requested that you have an echocardiogram. Echocardiography is a painless test that uses sound waves to create images of your heart. It provides your doctor with information about the size and shape of your heart and how well your heart's chambers and valves are working. This procedure takes approximately one hour. There are no restrictions for this procedure.  Your physician has requested that you have a cardiac catheterization. Cardiac catheterization is used to diagnose  and/or treat various heart conditions. Doctors may recommend this procedure for a number of different reasons. The most common reason is to evaluate chest pain. Chest pain can be a symptom of coronary artery disease (CAD), and cardiac catheterization can show whether plaque is narrowing or blocking your heart's arteries. This procedure is also used to evaluate the valves, as well as measure the blood flow and oxygen levels in different parts of your heart. For further information please visit [email or web address]. Please follow instruction sheet, as given.   Follow-Up: At [facility], you and your health needs are our priority.  As part of our continuing mission to provide you with exceptional heart care, we have created designated Provider Care Teams.  These Care Teams include your primary Cardiologist (physician) and Advanced Practice Providers (APPs -  Physician Assistants and Nurse Practitioners) who all work together to provide you with the care you need, when you need it.  We recommend signing up for the patient portal called "MyChart".  Sign up information is provided on this After Visit Summary.  MyChart is used to connect with patients for Virtual Visits (Telemedicine).  Patients are able to view lab/test results, encounter notes, upcoming appointments, etc.  Non-urgent messages can be sent to your provider as well.   To learn more about what you can do with MyChart, go to [email or web address].    Your next appointment:   3 month(s)  The format for your next appointment:   In Person  Provider:   Early Osmond, MD     Other Instructions  [place] OFFICE [place], [place] [address] Dept: [phone] Loc: [place]  06/19/2021  You are scheduled for a Cardiac Catheterization on Monday November 28 with Dr. Lenna Sciara.  1. Please arrive at the [facility] (Main Entrance A) at [facility]: [address] [place], [place] [address] at 5:30 AM (This time is two hours before your procedure to ensure your preparation). Free valet parking service is available.   Special note: Every effort is made to have your procedure done on time. Please understand that emergencies sometimes delay  scheduled procedures.  2. Diet: Do not eat solid foods after midnight.  The patient may have clear liquids until 5am upon the day of the procedure.  3. Labs: You will need to have blood drawn today. You do not need to be fasting.  4. Medication instructions in preparation for your procedure:   Contrast Allergy: No  On the morning of your procedure, take your Aspirin 81 mg and any morning medicines NOT listed above.  You may use sips of water.  5. Plan for one night stay--bring personal belongings. 6. Bring a current list of your medications and current insurance cards. 7. You MUST have a responsible person to drive you home. 8. Someone MUST be with you the first 24 hours after you arrive home or your discharge will be delayed. 9. Please wear clothes that are easy to get on and off and wear slip-on shoes.  Thank you for allowing [place] to care for you!   -- [place] [facility]

## 2021-06-18 NOTE — Progress Notes (Signed)
Cardiology Office Note:    Date:  06/19/2021   ID:  Dale Elliott, DOB [birth year], MRN [id number]  PCP:  Curlene Labrum, MD  Cardiologist:  Early Osmond, MD  Electrophysiologist:  None   Referring MD: Manon Hilding, MD   Chief Complaint/Reason for Referral: Abnormal stress test  History of Present Illness:    PROBLEM LIST: 1.  Hyperlipidemia 2.  Arthritis 3.  Tobacco abuse 4.  COPD  Dale Elliott is a 75 y.o. male with the indicated history the patient had been seen by his primary care provider recently.  He was complaining of chest pressure that happens every few weeks not associated with exertion or shortness of breath.  He was referred for stress test which demonstrated high risk findings with a large anteroseptal area of ischemia.  An ejection fraction or wall motion abnormalities could not be assessed.  The patient tells me that he occasionally gets chest tightness but he cannot specify.  He tells me its not that bad and does not occur that often.  He does wheeze on occasion due to COPD.  He has had no bad bleeding or bruising episodes.  He has never had a stroke.  He has some shortness of breath when he lays flat.  He denies any peripheral edema.  He has required no hospitalizations or emergency room visits.  Past Medical History:  Diagnosis Date   Arthritis     Past Surgical History:  Procedure Laterality Date   BACK SURGERY     1988 1990   BIOPSY THYROID Left 2012   CATARACT EXTRACTION W/PHACO Left 04/22/2014   Procedure: CATARACT EXTRACTION PHACO AND INTRAOCULAR LENS PLACEMENT ([place]);  Surgeon: Tonny Branch, MD;  Location: [facility];  Service: Ophthalmology;  Laterality: Left;  CDE:  9.68   CATARACT EXTRACTION W/PHACO Right 05/09/2014   Procedure: CATARACT EXTRACTION PHACO AND INTRAOCULAR LENS PLACEMENT RIGHT EYE CDE=15.76;  Surgeon: Tonny Branch, MD;  Location: [facility];  Service: Ophthalmology;  Laterality: Right;    Current Medications: Current Meds   Medication Sig   amitriptyline (ELAVIL) 25 MG tablet Take 25 mg by mouth at bedtime.   amLODipine (NORVASC) 10 MG tablet Take 1 tablet (10 mg total) by mouth daily.   aspirin 325 MG tablet Take 81.25 mg by mouth daily.   nitroGLYCERIN (NITROSTAT) 0.4 MG SL tablet Place 1 tablet (0.4 mg total) under the tongue every 5 (five) minutes as needed for chest pain.   simvastatin (ZOCOR) 20 MG tablet Take 20 mg by mouth daily.   [DISCONTINUED] BESIVANCE 0.6 % SUSP    [DISCONTINUED] DUREZOL 0.05 % EMUL      Allergies:   Patient has no known allergies.   Social History   Tobacco Use   Smoking status: Every Day    Packs/day: 1.00    Years: 55.00    Pack years: 55.00    Types: Cigarettes   Smokeless tobacco: Never  Substance Use Topics   Alcohol use: No   Drug use: No     Family History: The patient's family history includes Stroke in his father.  ROS:   Please see the history of present illness.    All other systems reviewed and are negative.  EKGs/Labs/Other Studies Reviewed:    The following studies were reviewed today:  EKG: Sinus rhythm with PACs  Imaging studies that I have independently reviewed today: Stress test as detailed above, labs demonstrate a TSH of 3.8 hemoglobin 15.9, platelets 220, labs from October  2022 demonstrated BUN of 14 creatinine 0.9 sodium 140 potassium 4.3 LFTs within normal limits  TTE 11/22 (Care Everywhere)  1. The left ventricle is normal in size with normal wall thickness.    2. The left ventricular systolic function is normal, LVEF is visually  estimated at > 55%.    3. The left atrium is mildly dilated in size.    4. The right ventricle is normal in size, with normal systolic function.   Recent Labs: No results found for requested labs within last 8760 hours.  Recent Lipid Panel No results found for: CHOL, TRIG, HDL, CHOLHDL, VLDL, LDLCALC, LDLDIRECT  Lipid panel from October 2022 demonstrates a cholesterol of 129, HDL 51, triglycerides  88, and LDL 58; creatinine was 0.9  Physical Exam:    VS:  BP (!) 160/64   Pulse 79   Ht 5\' 7"  (1.702 m)   Wt 155 lb (70.3 kg)   SpO2 98%   BMI 24.28 kg/m     Wt Readings from Last 5 Encounters:  06/19/21 155 lb (70.3 kg)  04/17/14 145 lb (65.8 kg)    GENERAL:  No apparent distress, AOx3 HEENT:  No carotid bruits, +2 carotid impulses, no scleral icterus CAR: RRR no murmurs, gallops, rubs, or thrills RES:  Clear to auscultation bilaterally ABD:  Soft, nontender, nondistended, positive bowel sounds x 4 VASC:  +2 radial pulses, +2 carotid pulses, palpable pedal pulses NEURO:  CN 2-12 grossly intact; motor and sensory grossly intact PSYCH:  No active depression or anxiety EXT:  No edema  ASSESSMENT:    1. Abnormal nuclear stress test   2. Hyperlipidemia, unspecified hyperlipidemia type   3. Hypertension, unspecified type   4. Pre-procedural laboratory examination    PLAN:    Abnormal nuclear stress test Given the high risk stress test we will proceed to cardiac catheterization.  His ejection fraction on recent TTE is normal.  We will prescribe the patient as needed nitroglycerin.  Given his COPD I will refrain from beta-blockade.  Further recommendations will follow results of this test.  I will have him follow-up in 3 months time or earlier if needed but this will be informed of the results of the cardiac catheterization  Hyperlipidemia, unspecified hyperlipidemia type Continue simvastatin we will check a lipid panel and CMP in the future.  Hypertension, unspecified type We will start amlodipine 10 mg daily.   Shared Decision Making/Informed Consent:   Shared Decision Making/Informed Consent The risks [stroke (1 in 1000), death (1 in 1000), kidney failure [usually temporary] (1 in 500), bleeding (1 in 200), allergic reaction [possibly serious] (1 in 200)], benefits (diagnostic support and management of coronary artery disease) and alternatives of a cardiac catheterization  were discussed in detail with Dale Elliott and he is willing to proceed.   Medication Adjustments/Labs and Tests Ordered: Current medicines are reviewed at length with the patient today.  Concerns regarding medicines are outlined above.   Orders Placed This Encounter  Procedures   Basic Metabolic Panel (BMET)   CBC   EKG 12-Lead   ECHOCARDIOGRAM COMPLETE     Meds ordered this encounter  Medications   amLODipine (NORVASC) 10 MG tablet    Sig: Take 1 tablet (10 mg total) by mouth daily.    Dispense:  90 tablet    Refill:  3   nitroGLYCERIN (NITROSTAT) 0.4 MG SL tablet    Sig: Place 1 tablet (0.4 mg total) under the tongue every 5 (five) minutes as needed for chest  pain.    Dispense:  90 tablet    Refill:  3     Patient Instructions  Medication Instructions:  Your physician has recommended you make the following change in your medication:  1.) start amlodipine 10 mg - one tablet daily 2.) nitroglycerin 0.4 mg - use as directed  *If you need a refill on your cardiac medications before your next appointment, please call your pharmacy*   Lab Work: Today: BMET, CBC  If you have labs (blood work) drawn today and your tests are completely normal, you will receive your results only by: [place] (if you have MyChart) OR A paper copy in the mail If you have any lab test that is abnormal or we need to change your treatment, we will call you to review the results.   Testing/Procedures: Your physician has requested that you have an echocardiogram. Echocardiography is a painless test that uses sound waves to create images of your heart. It provides your doctor with information about the size and shape of your heart and how well your heart's chambers and valves are working. This procedure takes approximately one hour. There are no restrictions for this procedure.  Your physician has requested that you have a cardiac catheterization. Cardiac catheterization is used to diagnose  and/or treat various heart conditions. Doctors may recommend this procedure for a number of different reasons. The most common reason is to evaluate chest pain. Chest pain can be a symptom of coronary artery disease (CAD), and cardiac catheterization can show whether plaque is narrowing or blocking your heart's arteries. This procedure is also used to evaluate the valves, as well as measure the blood flow and oxygen levels in different parts of your heart. For further information please visit HugeFiesta.[email or web address]. Please follow instruction sheet, as given.   Follow-Up: At [facility], you and your health needs are our priority.  As part of our continuing mission to provide you with exceptional heart care, we have created designated Provider Care Teams.  These Care Teams include your primary Cardiologist (physician) and Advanced Practice Providers (APPs -  Physician Assistants and Nurse Practitioners) who all work together to provide you with the care you need, when you need it.  We recommend signing up for the patient portal called "MyChart".  Sign up information is provided on this After Visit Summary.  MyChart is used to connect with patients for Virtual Visits (Telemedicine).  Patients are able to view lab/test results, encounter notes, upcoming appointments, etc.  Non-urgent messages can be sent to your provider as well.   To learn more about what you can do with MyChart, go to [email or web address].    Your next appointment:   3 month(s)  The format for your next appointment:   In Person  Provider:   Early Osmond, MD     Other Instructions  [place] OFFICE [place], [place] [address] Dept: [phone] Loc: [place]  06/19/2021  You are scheduled for a Cardiac Catheterization on Monday November 28 with Dr. Lenna Sciara.  1. Please arrive at the [facility] (Main Entrance A) at [facility]: [address] [place], [place] [address] at 5:30 AM (This time is two hours before your procedure to ensure your preparation). Free valet parking service is available.   Special note: Every effort is made to have your procedure done on time. Please understand that emergencies sometimes delay  scheduled procedures.  2. Diet: Do not eat solid foods after midnight.  The patient may have clear liquids until 5am upon the day of the procedure.  3. Labs: You will need to have blood drawn today. You do not need to be fasting.  4. Medication instructions in preparation for your procedure:   Contrast Allergy: No  On the morning of your procedure, take your Aspirin 81 mg and any morning medicines NOT listed above.  You may use sips of water.  5. Plan for one night stay--bring personal belongings. 6. Bring a current list of your medications and current insurance cards. 7. You MUST have a responsible person to drive you home. 8. Someone MUST be with you the first 24 hours after you arrive home or your discharge will be delayed. 9. Please wear clothes that are easy to get on and off and wear slip-on shoes.  Thank you for allowing [place] to care for you!   -- [place] [facility]

## 2021-06-19 ENCOUNTER — Other Ambulatory Visit: Payer: Self-pay

## 2021-06-19 ENCOUNTER — Ambulatory Visit: Payer: Medicare HMO | Admitting: Internal Medicine

## 2021-06-19 ENCOUNTER — Other Ambulatory Visit: Payer: Self-pay | Admitting: Internal Medicine

## 2021-06-19 ENCOUNTER — Encounter: Payer: Self-pay | Admitting: Internal Medicine

## 2021-06-19 VITALS — BP 160/64 | HR 79 | Ht 67.0 in | Wt 155.0 lb

## 2021-06-19 DIAGNOSIS — Z01818 Encounter for other preprocedural examination: Secondary | ICD-10-CM | POA: Diagnosis not present

## 2021-06-19 DIAGNOSIS — Z01812 Encounter for preprocedural laboratory examination: Secondary | ICD-10-CM

## 2021-06-19 DIAGNOSIS — E785 Hyperlipidemia, unspecified: Secondary | ICD-10-CM | POA: Diagnosis not present

## 2021-06-19 DIAGNOSIS — R9439 Abnormal result of other cardiovascular function study: Secondary | ICD-10-CM

## 2021-06-19 DIAGNOSIS — I10 Essential (primary) hypertension: Secondary | ICD-10-CM

## 2021-06-19 DIAGNOSIS — I340 Nonrheumatic mitral (valve) insufficiency: Secondary | ICD-10-CM

## 2021-06-19 MED ORDER — SODIUM CHLORIDE 0.9% FLUSH: 3.0000 mL | Freq: Two times a day (BID) | INTRAVENOUS | Status: AC

## 2021-06-19 MED ORDER — AMLODIPINE BESYLATE 10 MG PO TABS: 10.0000 mg | ORAL_TABLET | Freq: Every day | ORAL | 3 refills | Status: DC

## 2021-06-19 MED ORDER — NITROGLYCERIN 0.4 MG SL SUBL: 0.4000 mg | SUBLINGUAL_TABLET | SUBLINGUAL | 3 refills | Status: AC | PRN

## 2021-06-19 NOTE — Patient Instructions (Signed)
Medication Instructions:  Your physician has recommended you make the following change in your medication:  1.) start amlodipine 10 mg - one tablet daily 2.) nitroglycerin 0.4 mg - use as directed  *If you need a refill on your cardiac medications before your next appointment, please call your pharmacy*   Lab Work: Today: BMET, CBC  If you have labs (blood work) drawn today and your tests are completely normal, you will receive your results only by: [place] (if you have MyChart) OR A paper copy in the mail If you have any lab test that is abnormal or we need to change your treatment, we will call you to review the results.   Testing/Procedures: Your physician has requested that you have an echocardiogram. Echocardiography is a painless test that uses sound waves to create images of your heart. It provides your doctor with information about the size and shape of your heart and how well your heart's chambers and valves are working. This procedure takes approximately one hour. There are no restrictions for this procedure.  Your physician has requested that you have a cardiac catheterization. Cardiac catheterization is used to diagnose and/or treat various heart conditions. Doctors may recommend this procedure for a number of different reasons. The most common reason is to evaluate chest pain. Chest pain can be a symptom of coronary artery disease (CAD), and cardiac catheterization can show whether plaque is narrowing or blocking your heart's arteries. This procedure is also used to evaluate the valves, as well as measure the blood flow and oxygen levels in different parts of your heart. For further information please visit [email or web address]. Please follow instruction sheet, as given.   Follow-Up: At [facility], you and your health needs are our priority.  As part of our continuing mission to provide you with exceptional heart care, we have created designated Provider Care Teams.   These Care Teams include your primary Cardiologist (physician) and Advanced Practice Providers (APPs -  Physician Assistants and Nurse Practitioners) who all work together to provide you with the care you need, when you need it.  We recommend signing up for the patient portal called "MyChart".  Sign up information is provided on this After Visit Summary.  MyChart is used to connect with patients for Virtual Visits (Telemedicine).  Patients are able to view lab/test results, encounter notes, upcoming appointments, etc.  Non-urgent messages can be sent to your provider as well.   To learn more about what you can do with MyChart, go to [email or web address].    Your next appointment:   3 month(s)  The format for your next appointment:   In Person  Provider:   Early Osmond, MD     Other Instructions  [facility] OFFICE [place], [place] [address] Dept: [phone] Loc: [place]  06/19/2021  You are scheduled for a Cardiac Catheterization on Monday November 28 with Dr. Lenna Sciara.  1. Please arrive at the [facility] (Main Entrance A) at [facility]: [address] [place], [place] [address] at 5:30 AM (This time is two hours before your procedure to ensure your preparation). Free valet parking service is available.   Special note: Every effort is made to have your procedure done on time. Please understand that emergencies sometimes delay scheduled procedures.  2. Diet: Do not eat solid foods after midnight.  The patient may have clear liquids until 5am upon  the day of the procedure.  3. Labs: You will need to have blood drawn today. You do not need to be fasting.  4. Medication instructions in preparation for your procedure:   Contrast Allergy: No  On the morning of your procedure, take your Aspirin 81 mg and any morning medicines NOT listed  above.  You may use sips of water.  5. Plan for one night stay--bring personal belongings. 6. Bring a current list of your medications and current insurance cards. 7. You MUST have a responsible person to drive you home. 8. Someone MUST be with you the first 24 hours after you arrive home or your discharge will be delayed. 9. Please wear clothes that are easy to get on and off and wear slip-on shoes.  Thank you for allowing [place] to care for you!   -- [facility]

## 2021-06-20 LAB — CBC
Hematocrit: 48.3 % (ref 37.5–51.0)
Hemoglobin: 16.4 g/dL (ref 13.0–17.7)
MCH: 32 pg (ref 26.6–33.0)
MCHC: 34 g/dL (ref 31.5–35.7)
MCV: 94 fL (ref 79–97)
Platelets: 241 10*3/uL (ref 150–450)
RBC: 5.12 x10E6/uL (ref 4.14–5.80)
RDW: 12.2 % (ref 11.6–15.4)
WBC: 8.1 10*3/uL (ref 3.4–10.8)

## 2021-06-20 LAB — BASIC METABOLIC PANEL
BUN/Creatinine Ratio: 19 (ref 10–24)
BUN: 15 mg/dL (ref 8–27)
CO2: 23 mmol/L (ref 20–29)
Calcium: 10 mg/dL (ref 8.6–10.2)
Chloride: 99 mmol/L (ref 96–106)
Creatinine, Ser: 0.81 mg/dL (ref 0.76–1.27)
Glucose: 100 mg/dL — ABNORMAL HIGH (ref 70–99)
Potassium: 4.3 mmol/L (ref 3.5–5.2)
Sodium: 139 mmol/L (ref 134–144)
eGFR: 91 mL/min/{1.73_m2} (ref >59)

## 2021-06-22 DIAGNOSIS — M47816 Spondylosis without myelopathy or radiculopathy, lumbar region: Secondary | ICD-10-CM | POA: Diagnosis not present

## 2021-06-22 DIAGNOSIS — M47812 Spondylosis without myelopathy or radiculopathy, cervical region: Secondary | ICD-10-CM | POA: Diagnosis not present

## 2021-06-22 DIAGNOSIS — M9901 Segmental and somatic dysfunction of cervical region: Secondary | ICD-10-CM | POA: Diagnosis not present

## 2021-06-22 DIAGNOSIS — M9902 Segmental and somatic dysfunction of thoracic region: Secondary | ICD-10-CM | POA: Diagnosis not present

## 2021-06-22 DIAGNOSIS — M9903 Segmental and somatic dysfunction of lumbar region: Secondary | ICD-10-CM | POA: Diagnosis not present

## 2021-06-22 DIAGNOSIS — S233XXA Sprain of ligaments of thoracic spine, initial encounter: Secondary | ICD-10-CM | POA: Diagnosis not present

## 2021-07-06 ENCOUNTER — Ambulatory Visit (HOSPITAL_COMMUNITY)
Admission: RE | Admit: 2021-07-06 | Discharge: 2021-07-06 | Disposition: A | Discharge status: Home / Self Care | Payer: Medicare HMO | Attending: Internal Medicine | Admitting: Internal Medicine

## 2021-07-06 ENCOUNTER — Other Ambulatory Visit: Payer: Self-pay

## 2021-07-06 ENCOUNTER — Encounter (HOSPITAL_COMMUNITY)
Admission: RE | Disposition: A | Discharge status: Home / Self Care | Payer: Self-pay | Source: Home / Self Care | Attending: Internal Medicine

## 2021-07-06 ENCOUNTER — Encounter (HOSPITAL_COMMUNITY): Payer: Self-pay | Admitting: Internal Medicine

## 2021-07-06 DIAGNOSIS — J449 Chronic obstructive pulmonary disease, unspecified: Secondary | ICD-10-CM | POA: Diagnosis not present

## 2021-07-06 DIAGNOSIS — I209 Angina pectoris, unspecified: Secondary | ICD-10-CM | POA: Diagnosis not present

## 2021-07-06 DIAGNOSIS — I25119 Atherosclerotic heart disease of native coronary artery with unspecified angina pectoris: Secondary | ICD-10-CM | POA: Insufficient documentation

## 2021-07-06 DIAGNOSIS — I340 Nonrheumatic mitral (valve) insufficiency: Secondary | ICD-10-CM

## 2021-07-06 DIAGNOSIS — F17210 Nicotine dependence, cigarettes, uncomplicated: Secondary | ICD-10-CM | POA: Diagnosis not present

## 2021-07-06 DIAGNOSIS — E785 Hyperlipidemia, unspecified: Secondary | ICD-10-CM | POA: Diagnosis not present

## 2021-07-06 DIAGNOSIS — I10 Essential (primary) hypertension: Secondary | ICD-10-CM | POA: Insufficient documentation

## 2021-07-06 DIAGNOSIS — R9439 Abnormal result of other cardiovascular function study: Secondary | ICD-10-CM | POA: Diagnosis present

## 2021-07-06 DIAGNOSIS — R69 Illness, unspecified: Secondary | ICD-10-CM | POA: Diagnosis not present

## 2021-07-06 HISTORY — PX: LEFT HEART CATH AND CORONARY ANGIOGRAPHY: CATH118249

## 2021-07-06 SURGERY — LEFT HEART CATH AND CORONARY ANGIOGRAPHY
Anesthesia: LOCAL

## 2021-07-06 MED ORDER — HEPARIN SODIUM (PORCINE) 1000 UNIT/ML IJ SOLN
INTRAMUSCULAR | Status: DC | PRN
  Administered 2021-07-06: 5000 [IU] via INTRAVENOUS

## 2021-07-06 MED ORDER — MIDAZOLAM HCL 2 MG/2ML IJ SOLN
INTRAMUSCULAR | Status: DC | PRN
  Administered 2021-07-06 (×2): 1 mg via INTRAVENOUS

## 2021-07-06 MED ORDER — MIDAZOLAM HCL 2 MG/2ML IJ SOLN
INTRAMUSCULAR | Status: AC
  Filled 2021-07-06: qty 2

## 2021-07-06 MED ORDER — SODIUM CHLORIDE 0.9% FLUSH: 3.0000 mL | INTRAVENOUS | Status: DC | PRN

## 2021-07-06 MED ORDER — LIDOCAINE HCL (PF) 1 % IJ SOLN
INTRAMUSCULAR | Status: AC
  Filled 2021-07-06: qty 30

## 2021-07-06 MED ORDER — SODIUM CHLORIDE 0.9 % IV SOLN: 250.0000 mL | INTRAVENOUS | Status: DC | PRN

## 2021-07-06 MED ORDER — SODIUM CHLORIDE 0.9 % IV SOLN: INTRAVENOUS | Status: DC

## 2021-07-06 MED ORDER — SODIUM CHLORIDE 0.9% FLUSH: 3.0000 mL | Freq: Two times a day (BID) | INTRAVENOUS | Status: DC

## 2021-07-06 MED ORDER — LIDOCAINE HCL (PF) 1 % IJ SOLN
INTRAMUSCULAR | Status: DC | PRN
  Administered 2021-07-06: 5 mL

## 2021-07-06 MED ORDER — IOHEXOL 350 MG/ML SOLN
INTRAVENOUS | Status: DC | PRN
  Administered 2021-07-06: 08:00:00 40 mL via INTRA_ARTERIAL

## 2021-07-06 MED ORDER — HEPARIN (PORCINE) IN NACL 1000-0.9 UT/500ML-% IV SOLN
INTRAVENOUS | Status: DC | PRN
  Administered 2021-07-06 (×2): 500 mL

## 2021-07-06 MED ORDER — HEPARIN (PORCINE) IN NACL 1000-0.9 UT/500ML-% IV SOLN
INTRAVENOUS | Status: AC
  Filled 2021-07-06: qty 1000

## 2021-07-06 MED ORDER — LABETALOL HCL 5 MG/ML IV SOLN: 10.0000 mg | INTRAVENOUS | Status: DC | PRN

## 2021-07-06 MED ORDER — HEPARIN SODIUM (PORCINE) 1000 UNIT/ML IJ SOLN
INTRAMUSCULAR | Status: AC
  Filled 2021-07-06: qty 10

## 2021-07-06 MED ORDER — ONDANSETRON HCL 4 MG/2ML IJ SOLN: 4.0000 mg | Freq: Four times a day (QID) | INTRAMUSCULAR | Status: DC | PRN

## 2021-07-06 MED ORDER — ASPIRIN 81 MG PO CHEW: 81.0000 mg | CHEWABLE_TABLET | ORAL | Status: DC

## 2021-07-06 MED ORDER — FENTANYL CITRATE (PF) 100 MCG/2ML IJ SOLN
INTRAMUSCULAR | Status: AC
  Filled 2021-07-06: qty 2

## 2021-07-06 MED ORDER — FENTANYL CITRATE (PF) 100 MCG/2ML IJ SOLN
INTRAMUSCULAR | Status: DC | PRN
  Administered 2021-07-06 (×2): 25 ug via INTRAVENOUS

## 2021-07-06 MED ORDER — ACETAMINOPHEN 325 MG PO TABS: 650.0000 mg | ORAL_TABLET | ORAL | Status: DC | PRN

## 2021-07-06 MED ORDER — VERAPAMIL HCL 2.5 MG/ML IV SOLN
INTRAVENOUS | Status: AC
  Filled 2021-07-06: qty 2

## 2021-07-06 MED ORDER — VERAPAMIL HCL 2.5 MG/ML IV SOLN
INTRAVENOUS | Status: DC | PRN
  Administered 2021-07-06: 08:00:00 10 mL via INTRA_ARTERIAL

## 2021-07-06 MED ORDER — HYDRALAZINE HCL 20 MG/ML IJ SOLN: 10.0000 mg | INTRAMUSCULAR | Status: DC | PRN

## 2021-07-06 SURGICAL SUPPLY — 11 items
CATH DIAG 6FR JR4 (CATHETERS) ×1 IMPLANT
CATH INFINITI 5FR ANG PIGTAIL (CATHETERS) ×1 IMPLANT
CATH INFINITI 6F FL3.5 (CATHETERS) ×1 IMPLANT
DEVICE RAD COMP TR BAND LRG (VASCULAR PRODUCTS) ×1 IMPLANT
GLIDESHEATH SLEND SS 6F .021 (SHEATH) ×1 IMPLANT
KIT HEART LEFT (KITS) ×2 IMPLANT
PACK CARDIAC CATHETERIZATION (CUSTOM PROCEDURE TRAY) ×2 IMPLANT
SYR MEDRAD MARK 7 150ML (SYRINGE) ×2 IMPLANT
TRANSDUCER W/STOPCOCK (MISCELLANEOUS) ×2 IMPLANT
TUBING CIL FLEX 10 FLL-RA (TUBING) ×2 IMPLANT
WIRE EMERALD 3MM-J .035X260CM (WIRE) ×1 IMPLANT

## 2021-07-06 NOTE — Interval H&P Note (Signed)
History and Physical Interval Note:  07/06/2021 6:47 AM  Dale Elliott  has presented today for surgery, with the diagnosis of abnormal nuc.  The various methods of treatment have been discussed with the patient and family. After consideration of risks, benefits and other options for treatment, the patient has consented to  Procedure(s): LEFT HEART CATH AND CORONARY ANGIOGRAPHY (N/A) as a surgical intervention.  The patient's history has been reviewed, patient examined, no change in status, stable for surgery.  I have reviewed the patient's chart and labs.  Questions were answered to the patient's satisfaction.    Cath Lab Visit (complete for each Cath Lab visit)  Clinical Evaluation Leading to the Procedure:   ACS: No.  Non-ACS:    Anginal Classification: CCS II  Anti-ischemic medical therapy: Minimal Therapy (1 class of medications)  Non-Invasive Test Results: High-risk stress test findings: cardiac mortality >3%/year  Prior CABG: No previous CABG        Early Osmond

## 2021-07-08 DIAGNOSIS — M1711 Unilateral primary osteoarthritis, right knee: Secondary | ICD-10-CM | POA: Diagnosis not present

## 2021-07-08 DIAGNOSIS — E7849 Other hyperlipidemia: Secondary | ICD-10-CM | POA: Diagnosis not present

## 2021-07-08 DIAGNOSIS — I129 Hypertensive chronic kidney disease with stage 1 through stage 4 chronic kidney disease, or unspecified chronic kidney disease: Secondary | ICD-10-CM | POA: Diagnosis not present

## 2021-07-08 DIAGNOSIS — N183 Chronic kidney disease, stage 3 unspecified: Secondary | ICD-10-CM | POA: Diagnosis not present

## 2021-08-07 DIAGNOSIS — M19012 Primary osteoarthritis, left shoulder: Secondary | ICD-10-CM | POA: Diagnosis not present

## 2021-08-07 DIAGNOSIS — R7303 Prediabetes: Secondary | ICD-10-CM | POA: Diagnosis not present

## 2021-08-07 DIAGNOSIS — E7849 Other hyperlipidemia: Secondary | ICD-10-CM | POA: Diagnosis not present

## 2021-08-18 DIAGNOSIS — M9901 Segmental and somatic dysfunction of cervical region: Secondary | ICD-10-CM | POA: Diagnosis not present

## 2021-08-18 DIAGNOSIS — M47816 Spondylosis without myelopathy or radiculopathy, lumbar region: Secondary | ICD-10-CM | POA: Diagnosis not present

## 2021-08-18 DIAGNOSIS — S233XXA Sprain of ligaments of thoracic spine, initial encounter: Secondary | ICD-10-CM | POA: Diagnosis not present

## 2021-08-18 DIAGNOSIS — M47812 Spondylosis without myelopathy or radiculopathy, cervical region: Secondary | ICD-10-CM | POA: Diagnosis not present

## 2021-08-18 DIAGNOSIS — M9903 Segmental and somatic dysfunction of lumbar region: Secondary | ICD-10-CM | POA: Diagnosis not present

## 2021-08-18 DIAGNOSIS — M9902 Segmental and somatic dysfunction of thoracic region: Secondary | ICD-10-CM | POA: Diagnosis not present

## 2021-08-21 ENCOUNTER — Ambulatory Visit
Admission: RE | Admit: 2021-08-21 | Discharge: 2021-08-21 | Disposition: A | Discharge status: Home / Self Care | Payer: Self-pay | Source: Ambulatory Visit | Attending: Internal Medicine | Admitting: Internal Medicine

## 2021-08-21 ENCOUNTER — Other Ambulatory Visit: Payer: Self-pay

## 2021-08-21 DIAGNOSIS — I340 Nonrheumatic mitral (valve) insufficiency: Secondary | ICD-10-CM

## 2021-09-08 DIAGNOSIS — M47816 Spondylosis without myelopathy or radiculopathy, lumbar region: Secondary | ICD-10-CM | POA: Diagnosis not present

## 2021-09-08 DIAGNOSIS — M47812 Spondylosis without myelopathy or radiculopathy, cervical region: Secondary | ICD-10-CM | POA: Diagnosis not present

## 2021-09-08 DIAGNOSIS — S233XXA Sprain of ligaments of thoracic spine, initial encounter: Secondary | ICD-10-CM | POA: Diagnosis not present

## 2021-09-08 DIAGNOSIS — M9901 Segmental and somatic dysfunction of cervical region: Secondary | ICD-10-CM | POA: Diagnosis not present

## 2021-09-08 DIAGNOSIS — M9902 Segmental and somatic dysfunction of thoracic region: Secondary | ICD-10-CM | POA: Diagnosis not present

## 2021-09-08 DIAGNOSIS — M9903 Segmental and somatic dysfunction of lumbar region: Secondary | ICD-10-CM | POA: Diagnosis not present

## 2021-09-29 DIAGNOSIS — M9903 Segmental and somatic dysfunction of lumbar region: Secondary | ICD-10-CM | POA: Diagnosis not present

## 2021-09-29 DIAGNOSIS — M9902 Segmental and somatic dysfunction of thoracic region: Secondary | ICD-10-CM | POA: Diagnosis not present

## 2021-09-29 DIAGNOSIS — M47812 Spondylosis without myelopathy or radiculopathy, cervical region: Secondary | ICD-10-CM | POA: Diagnosis not present

## 2021-09-29 DIAGNOSIS — S233XXA Sprain of ligaments of thoracic spine, initial encounter: Secondary | ICD-10-CM | POA: Diagnosis not present

## 2021-09-29 DIAGNOSIS — M47816 Spondylosis without myelopathy or radiculopathy, lumbar region: Secondary | ICD-10-CM | POA: Diagnosis not present

## 2021-09-29 DIAGNOSIS — M9901 Segmental and somatic dysfunction of cervical region: Secondary | ICD-10-CM | POA: Diagnosis not present

## 2021-10-04 NOTE — Progress Notes (Addendum)
Cardiology Office Note:    Date:  10/05/2021   ID:  Dale Elliott, DOB Sep 24, 1945, MRN [id number]  PCP:  Dale Labrum, MD   [place] Providers Cardiologist:  Dale Sciara, MD Referring MD: Dale Labrum, MD   Chief Complaint/Reason for Referral:  Follow up  ASSESSMENT:    Mild CAD  Hyperlipidemia, unspecified hyperlipidemia type  Chronic obstructive pulmonary disease, unspecified COPD type ([place])  PLAN:    In order of problems listed above:  ASA, statin, PRN NTG for now.  Follow up 1 year. Check lipid and LFTs today, goal LDL < 70.  Check Lp(a). Continue current treatment.  Has had aortic u/s for AAA screening by PCP in the past.  Stressed need to quit smoking.   Dispo:  Return in about 1 year (around 10/05/2022).     Medication Adjustments/Labs and Tests Ordered: Current medicines are reviewed at length with the patient today.  Concerns regarding medicines are outlined above.   Tests Ordered: No orders of the defined types were placed in this encounter.   Medication Changes: No orders of the defined types were placed in this encounter.   History of Present Illness:    FOCUSED CARDIOVASCULAR PROBLEM LIST:   High risk stress test 2022; coronary angiography 11/22 with mild obstructive disease 2.   Hyperlipidemia 3.   Tobacco abuse   The patient is a 76 y.o. male with the indicated medical history here for routine follow up.  He is doing well.  He denies any chest pain.  His shortness of breath with wheezing is relatively unchanged.  He does not see a pulmonologist and is not on inhalers.  He continues to smoke however.  He has required no hospitalizations or emergency room visits.  He occasionally gets lightheaded when he goes from sitting to standing quickly but he has not passed out.  I had an opportunity to review his blood pressure log from home.  His blood pressures are consistently less than 120/80.        Previous Medical History: Past  Medical History:  Diagnosis Date   Arthritis      Current Medications: Current Meds  Medication Sig   amitriptyline (ELAVIL) 50 MG tablet Take 50 mg by mouth at bedtime.   amLODipine (NORVASC) 10 MG tablet Take 1 tablet (10 mg total) by mouth daily.   Aspirin 81 MG CAPS Take 81 mg by mouth at bedtime.   Cholecalciferol (VITAMIN D3 SUPER STRENGTH) 50 MCG (2000 UT) TABS Take 2,000 Units by mouth daily.   halobetasol (ULTRAVATE) 0.05 % ointment Apply 1 application topically 2 (two) times daily as needed (Psoriasis).   HYDROcodone-Acetaminophen 7.5-300 MG TABS Take 1 tablet by mouth at bedtime as needed for pain.   nitroGLYCERIN (NITROSTAT) 0.4 MG SL tablet Place 1 tablet (0.4 mg total) under the tongue every 5 (five) minutes as needed for chest pain.   simvastatin (ZOCOR) 20 MG tablet Take 20 mg by mouth daily.   Current Facility-Administered Medications for the 10/05/21 encounter (Office Visit) with Dale Osmond, MD  Medication   sodium chloride flush (NS) 0.9 % injection 3 mL     Allergies:    Amoxicillin   Social History:   Social History   Tobacco Use   Smoking status: Every Day    Packs/day: 1.00    Years: 55.00    Pack years: 55.00    Types: Cigarettes   Smokeless tobacco: Never  Substance Use Topics   Alcohol use: No  Drug use: No     Family Hx: Family History  Problem Relation Age of Onset   Stroke Father      Review of Systems:   Please see the history of present illness.    All other systems reviewed and are negative.     EKGs/Labs/Other Test Reviewed:    EKG:  SR with PACs  Prior CV studies:  11/22 Cath:   Prox LAD lesion is 20% stenosed.   LV end diastolic pressure is normal.   The left ventricular ejection fraction is 55-65% by visual estimate.   1.  Mild obstructive coronary artery disease. 2.  Normal ejection fraction with LVEDP of 7 mmHg.  TTE 11/22 (Care Everywhere)  1. The left ventricle is normal in size with normal wall  thickness.    2. The left ventricular systolic function is normal, LVEF is visually  estimated at > 55%.    3. The left atrium is mildly dilated in size.    4. The right ventricle is normal in size, with normal systolic function.   Imaging studies that I have independently reviewed today: Cath and echo  Recent Labs: 06/19/2021: BUN 15; Creatinine, Ser 0.81; Hemoglobin 16.4; Platelets 241; Potassium 4.3; Sodium 139   Recent Lipid Panel No results found for: CHOL, TRIG, HDL, LDLCALC, LDLDIRECT  Risk Assessment/Calculations:          Physical Exam:    VS:  BP (!) 112/50 (BP Location: Left Arm, Patient Position: Sitting, Cuff Size: Normal)   Pulse 74   Ht 5\' 7"  (1.702 m)   Wt 161 lb (73 kg)   BMI 25.22 kg/m    Wt Readings from Last 3 Encounters:  10/05/21 161 lb (73 kg)  07/06/21 155 lb (70.3 kg)  06/19/21 155 lb (70.3 kg)    GENERAL:  No apparent distress, AOx3 HEENT:  No carotid bruits, +2 carotid impulses, no scleral icterus CAR: RRR no murmurs, gallops, rubs, or thrills RES:  Clear to auscultation bilaterally ABD:  Soft, nontender, nondistended, positive bowel sounds x 4 VASC:  +2 radial pulses, +2 carotid pulses, palpable pedal pulses NEURO:  CN 2-12 grossly intact; motor and sensory grossly intact PSYCH:  No active depression or anxiety EXT:  No edema, ecchymosis, or cyanosis  Signed, Dale Osmond, MD  10/05/2021 9:44 AM    [address], [place], [place]  [address] Phone: [phone]; Fax: [phone]   Note:  This document was prepared using Dragon voice recognition software and may include unintentional dictation errors.

## 2021-10-05 ENCOUNTER — Ambulatory Visit: Payer: Medicare HMO | Admitting: Internal Medicine

## 2021-10-05 ENCOUNTER — Other Ambulatory Visit: Payer: Self-pay

## 2021-10-05 ENCOUNTER — Encounter (INDEPENDENT_AMBULATORY_CARE_PROVIDER_SITE_OTHER): Payer: Self-pay

## 2021-10-05 ENCOUNTER — Encounter: Payer: Self-pay | Admitting: Internal Medicine

## 2021-10-05 VITALS — BP 112/50 | HR 74 | Ht 67.0 in | Wt 161.0 lb

## 2021-10-05 DIAGNOSIS — J449 Chronic obstructive pulmonary disease, unspecified: Secondary | ICD-10-CM | POA: Diagnosis not present

## 2021-10-05 DIAGNOSIS — I2510 Atherosclerotic heart disease of native coronary artery without angina pectoris: Secondary | ICD-10-CM

## 2021-10-05 DIAGNOSIS — E785 Hyperlipidemia, unspecified: Secondary | ICD-10-CM | POA: Diagnosis not present

## 2021-10-05 NOTE — Patient Instructions (Addendum)
Medication Instructions:  No changes *If you need a refill on your cardiac medications before your next appointment, please call your pharmacy*   Lab Work: LIPID ,LPA ,AND LIVER TODAY  If you have labs (blood work) drawn today and your tests are completely normal, you will receive your results only by: [place] (if you have MyChart) OR A paper copy in the mail If you have any lab test that is abnormal or we need to change your treatment, we will call you to review the results.   Testing/Procedures: NONE   Follow-Up: At [facility], you and your health needs are our priority.  As part of our continuing mission to provide you with exceptional heart care, we have created designated Provider Care Teams.  These Care Teams include your primary Cardiologist (physician) and Advanced Practice Providers (APPs -  Physician Assistants and Nurse Practitioners) who all work together to provide you with the care you need, when you need it.  We recommend signing up for the patient portal called "MyChart".  Sign up information is provided on this After Visit Summary.  MyChart is used to connect with patients for Virtual Visits (Telemedicine).  Patients are able to view lab/test results, encounter notes, upcoming appointments, etc.  Non-urgent messages can be sent to your provider as well.   To learn more about what you can do with MyChart, go to [email or web address].    Your next appointment:   1 year(s)  The format for your next appointment:   In Person  Provider:   Early Osmond, MD     Other Instructions NONE

## 2021-10-06 LAB — LIPID PANEL
Chol/HDL Ratio: 2.3 ratio (ref 0.0–5.0)
Cholesterol, Total: 115 mg/dL (ref 100–199)
HDL: 51 mg/dL (ref >39)
LDL Chol Calc (NIH): 37 mg/dL (ref 0–99)
Triglycerides: 161 mg/dL — ABNORMAL HIGH (ref 0–149)
VLDL Cholesterol Cal: 27 mg/dL (ref 5–40)

## 2021-10-06 LAB — HEPATIC FUNCTION PANEL
ALT: 14 IU/L (ref 0–44)
AST: 14 IU/L (ref 0–40)
Albumin: 4.8 g/dL — ABNORMAL HIGH (ref 3.7–4.7)
Alkaline Phosphatase: 61 IU/L (ref 44–121)
Bilirubin Total: 0.3 mg/dL (ref 0.0–1.2)
Bilirubin, Direct: <0.1 mg/dL (ref 0.00–0.40)
Total Protein: 6.9 g/dL (ref 6.0–8.5)

## 2021-10-06 LAB — LIPOPROTEIN A (LPA): Lipoprotein (a): 16.4 nmol/L (ref <75.0)

## 2021-10-13 DIAGNOSIS — L570 Actinic keratosis: Secondary | ICD-10-CM | POA: Diagnosis not present

## 2021-11-06 DIAGNOSIS — E7849 Other hyperlipidemia: Secondary | ICD-10-CM | POA: Diagnosis not present

## 2021-11-06 DIAGNOSIS — E1165 Type 2 diabetes mellitus with hyperglycemia: Secondary | ICD-10-CM | POA: Diagnosis not present

## 2021-11-06 DIAGNOSIS — I10 Essential (primary) hypertension: Secondary | ICD-10-CM | POA: Diagnosis not present

## 2021-11-20 DIAGNOSIS — E782 Mixed hyperlipidemia: Secondary | ICD-10-CM | POA: Diagnosis not present

## 2021-11-20 DIAGNOSIS — I10 Essential (primary) hypertension: Secondary | ICD-10-CM | POA: Diagnosis not present

## 2021-11-20 DIAGNOSIS — E7849 Other hyperlipidemia: Secondary | ICD-10-CM | POA: Diagnosis not present

## 2021-11-20 DIAGNOSIS — R7303 Prediabetes: Secondary | ICD-10-CM | POA: Diagnosis not present

## 2021-11-20 DIAGNOSIS — E1165 Type 2 diabetes mellitus with hyperglycemia: Secondary | ICD-10-CM | POA: Diagnosis not present

## 2021-11-20 DIAGNOSIS — E041 Nontoxic single thyroid nodule: Secondary | ICD-10-CM | POA: Diagnosis not present

## 2021-11-23 ENCOUNTER — Telehealth: Payer: Self-pay | Admitting: Internal Medicine

## 2021-11-23 DIAGNOSIS — R7303 Prediabetes: Secondary | ICD-10-CM | POA: Diagnosis not present

## 2021-11-23 DIAGNOSIS — J439 Emphysema, unspecified: Secondary | ICD-10-CM | POA: Diagnosis not present

## 2021-11-23 DIAGNOSIS — I259 Chronic ischemic heart disease, unspecified: Secondary | ICD-10-CM | POA: Diagnosis not present

## 2021-11-23 DIAGNOSIS — I240 Acute coronary thrombosis not resulting in myocardial infarction: Secondary | ICD-10-CM | POA: Diagnosis not present

## 2021-11-23 DIAGNOSIS — E7849 Other hyperlipidemia: Secondary | ICD-10-CM | POA: Diagnosis not present

## 2021-11-23 DIAGNOSIS — I2510 Atherosclerotic heart disease of native coronary artery without angina pectoris: Secondary | ICD-10-CM | POA: Diagnosis not present

## 2021-11-23 DIAGNOSIS — Z6825 Body mass index (BMI) 25.0-25.9, adult: Secondary | ICD-10-CM | POA: Diagnosis not present

## 2021-11-23 DIAGNOSIS — I700 Atherosclerosis of aorta: Secondary | ICD-10-CM | POA: Diagnosis not present

## 2021-11-23 NOTE — Telephone Encounter (Signed)
Pt c/o swelling: STAT is pt has developed SOB within 24 hours ? ?If swelling, where is the swelling located? Legs & feet  ? ?How much weight have you gained and in what time span? None  ? ?Have you gained 3 pounds in a day or 5 pounds in a week? No  ? ?Do you have a log of your daily weights (if so, list)? N/A ? ?Are you currently taking a fluid pill? No  ? ?Are you currently SOB? No more than normal  ? ?Have you traveled recently? No  ? ? ?  ?

## 2021-11-23 NOTE — Telephone Encounter (Signed)
Spoke with Dale Elliott and Dale Elliott was started on Amlodipine 10 mg in November and since mid March Dale Elliott has noted swelling Per Dale Elliott had appt with PCP and PCP told Dale Elliott swelling could be coming from med..Instructed Dale Elliott to check B/P over the next few days and call with readings Will forward t Dr Ali Lowe for review and recommendations ./cy ?

## 2021-11-23 NOTE — Telephone Encounter (Signed)
Pt aware of Dr Dara Hoyer response and will call later this week with B/P readings./cy ?

## 2021-12-03 NOTE — Telephone Encounter (Signed)
Per pt swelling is not bothersome Will continue with Amlodipine as directed Pt to call back If swelling worsens .cy ?

## 2021-12-03 NOTE — Telephone Encounter (Signed)
Called pt and was able to get some B/P readings   ?11-23-21 B/P 118-135/64-70 HR 65-71 ?11-24-21 B/P 114-133/61-77 HR 64-69 ?11-25-21 B/P 117-131/62-75 HR 62-70 ?11-26-21 B/P 115-133/61-76 HR 59-74 ?11-27-21 B/P 123-127/66-73 HR 62-70 ?12-01-21 B/P 121/74 HR 70 ?                     125.65 HR 69 ?12-02-21 B/P 127/73 HR 70 ?                     129/67 HR 68 ?12/03/21 this am B/P 122/73 HR 63 ? ?Pt continues to take Amlodipine 10 mg at bedtime  ?Also notes occ dizziness  Will forward to Dr Ali Lowe for review and recommendations./cy ?

## 2021-12-03 NOTE — Telephone Encounter (Signed)
Lm to call back ./cy 

## 2022-02-05 DIAGNOSIS — I10 Essential (primary) hypertension: Secondary | ICD-10-CM | POA: Diagnosis not present

## 2022-02-05 DIAGNOSIS — E1165 Type 2 diabetes mellitus with hyperglycemia: Secondary | ICD-10-CM | POA: Diagnosis not present

## 2022-02-15 DIAGNOSIS — M9901 Segmental and somatic dysfunction of cervical region: Secondary | ICD-10-CM | POA: Diagnosis not present

## 2022-02-15 DIAGNOSIS — M47816 Spondylosis without myelopathy or radiculopathy, lumbar region: Secondary | ICD-10-CM | POA: Diagnosis not present

## 2022-02-15 DIAGNOSIS — M47812 Spondylosis without myelopathy or radiculopathy, cervical region: Secondary | ICD-10-CM | POA: Diagnosis not present

## 2022-02-15 DIAGNOSIS — M9903 Segmental and somatic dysfunction of lumbar region: Secondary | ICD-10-CM | POA: Diagnosis not present

## 2022-02-15 DIAGNOSIS — M9902 Segmental and somatic dysfunction of thoracic region: Secondary | ICD-10-CM | POA: Diagnosis not present

## 2022-02-15 DIAGNOSIS — S233XXA Sprain of ligaments of thoracic spine, initial encounter: Secondary | ICD-10-CM | POA: Diagnosis not present

## 2022-04-05 ENCOUNTER — Telehealth: Payer: Self-pay | Admitting: Internal Medicine

## 2022-04-05 NOTE — Telephone Encounter (Signed)
New Message:       This patient would like to switch from Dr Chauncey Cruel to Dr  Gardiner Rhyme service in [place] please. The reason for this switch is, because it closer to patient's home to go to [place].

## 2022-04-06 NOTE — Telephone Encounter (Signed)
I will not be going to [place] anymore after the end of this year, would he want to be set up with one of our providers in [place]?

## 2022-04-08 ENCOUNTER — Telehealth: Payer: Self-pay | Admitting: Internal Medicine

## 2022-04-08 NOTE — Telephone Encounter (Signed)
Pt is requesting to switch from Dr. Ali Lowe to Dr. Harrington Challenger. Pt states he would like to go to the [place] office instead.

## 2022-05-13 ENCOUNTER — Other Ambulatory Visit: Payer: Self-pay | Admitting: Internal Medicine

## 2022-05-24 DIAGNOSIS — E7849 Other hyperlipidemia: Secondary | ICD-10-CM | POA: Diagnosis not present

## 2022-05-24 DIAGNOSIS — I10 Essential (primary) hypertension: Secondary | ICD-10-CM | POA: Diagnosis not present

## 2022-05-24 DIAGNOSIS — R7303 Prediabetes: Secondary | ICD-10-CM | POA: Diagnosis not present

## 2022-05-24 DIAGNOSIS — E1165 Type 2 diabetes mellitus with hyperglycemia: Secondary | ICD-10-CM | POA: Diagnosis not present

## 2022-05-24 DIAGNOSIS — Z720 Tobacco use: Secondary | ICD-10-CM | POA: Diagnosis not present

## 2022-05-27 ENCOUNTER — Telehealth: Payer: Self-pay | Admitting: Internal Medicine

## 2022-05-27 DIAGNOSIS — Z0001 Encounter for general adult medical examination with abnormal findings: Secondary | ICD-10-CM | POA: Diagnosis not present

## 2022-05-27 DIAGNOSIS — H6122 Impacted cerumen, left ear: Secondary | ICD-10-CM | POA: Diagnosis not present

## 2022-05-27 DIAGNOSIS — I10 Essential (primary) hypertension: Secondary | ICD-10-CM | POA: Diagnosis not present

## 2022-05-27 DIAGNOSIS — I240 Acute coronary thrombosis not resulting in myocardial infarction: Secondary | ICD-10-CM | POA: Diagnosis not present

## 2022-05-27 DIAGNOSIS — Z720 Tobacco use: Secondary | ICD-10-CM | POA: Diagnosis not present

## 2022-05-27 DIAGNOSIS — Z23 Encounter for immunization: Secondary | ICD-10-CM | POA: Diagnosis not present

## 2022-05-27 DIAGNOSIS — J439 Emphysema, unspecified: Secondary | ICD-10-CM | POA: Diagnosis not present

## 2022-05-27 DIAGNOSIS — I700 Atherosclerosis of aorta: Secondary | ICD-10-CM | POA: Diagnosis not present

## 2022-05-27 DIAGNOSIS — H6121 Impacted cerumen, right ear: Secondary | ICD-10-CM | POA: Diagnosis not present

## 2022-05-27 DIAGNOSIS — N401 Enlarged prostate with lower urinary tract symptoms: Secondary | ICD-10-CM | POA: Diagnosis not present

## 2022-05-27 DIAGNOSIS — R7303 Prediabetes: Secondary | ICD-10-CM | POA: Diagnosis not present

## 2022-05-27 DIAGNOSIS — E7849 Other hyperlipidemia: Secondary | ICD-10-CM | POA: Diagnosis not present

## 2022-05-27 NOTE — Telephone Encounter (Signed)
Caryl Pina from [facility] called and wanted to let Dr. Ali Lowe know that the patient is still experiencing dizziness since starting the amlodipine. Does he think it could be related to the amlodipine and do he have any other suggestions

## 2022-05-27 NOTE — Telephone Encounter (Signed)
I called the patient and reviewed that his dizziness started when he started the amlodipine 10 mg daily.  It is still occurring.  He reports BPs "stay low"  today he only recalls the bottoms number was in the 60s.  I asked him to decrease amlodipine to 5 mg daily for now and I told him I will check back with him in about 2 weeks to see if symptoms improved.  Will route to Dr. Ali Lowe to make aware and will call him if any other recommendations.    His plan is to follow up going forward in [place] but he has not been scheduled there yet.

## 2022-05-27 NOTE — Telephone Encounter (Signed)
Pt knows to take at night.

## 2022-10-12 DIAGNOSIS — L570 Actinic keratosis: Secondary | ICD-10-CM | POA: Diagnosis not present

## 2022-10-12 DIAGNOSIS — D485 Neoplasm of uncertain behavior of skin: Secondary | ICD-10-CM | POA: Diagnosis not present

## 2022-10-12 DIAGNOSIS — L820 Inflamed seborrheic keratosis: Secondary | ICD-10-CM | POA: Diagnosis not present

## 2022-11-05 ENCOUNTER — Encounter: Payer: Self-pay | Admitting: Internal Medicine

## 2022-11-05 ENCOUNTER — Ambulatory Visit: Payer: Medicare HMO | Attending: Internal Medicine | Admitting: Internal Medicine

## 2022-11-05 VITALS — BP 128/66 | HR 67 | Ht 67.0 in | Wt 162.0 lb

## 2022-11-05 DIAGNOSIS — I340 Nonrheumatic mitral (valve) insufficiency: Secondary | ICD-10-CM

## 2022-11-05 NOTE — Patient Instructions (Signed)
Medication Instructions:  Your physician recommends that you continue on your current medications as directed. Please refer to the Current Medication list given to you today.  *If you need a refill on your cardiac medications before your next appointment, please call your pharmacy*   Lab Work: None If you have labs (blood work) drawn today and your tests are completely normal, you will receive your results only by: [place] (if you have MyChart) OR A paper copy in the mail If you have any lab test that is abnormal or we need to change your treatment, we will call you to review the results.   Testing/Procedures: None   Follow-Up: At [facility], you and your health needs are our priority.  As part of our continuing mission to provide you with exceptional heart care, we have created designated Provider Care Teams.  These Care Teams include your primary Cardiologist (physician) and Advanced Practice Providers (APPs -  Physician Assistants and Nurse Practitioners) who all work together to provide you with the care you need, when you need it.  We recommend signing up for the patient portal called "MyChart".  Sign up information is provided on this After Visit Summary.  MyChart is used to connect with patients for Virtual Visits (Telemedicine).  Patients are able to view lab/test results, encounter notes, upcoming appointments, etc.  Non-urgent messages can be sent to your provider as well.   To learn more about what you can do with MyChart, go to [email or web address].    Your next appointment:   January/February 2025    Provider:   Dorris Carnes, MD    Other Instructions

## 2022-11-05 NOTE — Progress Notes (Signed)
Cardiology Office Note   Date:  11/05/2022   ID:  Dale Elliott, DOB 1945-09-09, MRN [id number]  PCP:  Curlene Labrum, MD  Cardiologist:   Dorris Carnes, MD   Follow up of CAD and HTN    History of Present Illness: Dale Elliott is a 77 y.o. male with a history chest tightness in the past   Seen by A Thukkani   Seen in NOv 2022 after having a stress test that had high risk findings  LHC done on 07/06/21 showed  LAD 20%, LVEF normal. The pt says since seen he denies chest pains   Prior to cath had some SOB    Attrib now to his lungs     HE says his breathing is good on level ground    With hills he gives out   Continues to smoke   Since age 27 yo.        Current Meds  Medication Sig   amitriptyline (ELAVIL) 50 MG tablet Take 50 mg by mouth at bedtime.   amLODipine (NORVASC) 10 MG tablet Take 10 mg by mouth daily.   Aspirin 81 MG CAPS Take 81 mg by mouth at bedtime.   Cholecalciferol (VITAMIN D3 SUPER STRENGTH) 50 MCG (2000 UT) TABS Take 2,000 Units by mouth daily.   halobetasol (ULTRAVATE) 0.05 % ointment Apply 1 application topically 2 (two) times daily as needed (Psoriasis).   HYDROcodone-Acetaminophen 7.5-300 MG TABS Take 1 tablet by mouth at bedtime as needed for pain.   nitroGLYCERIN (NITROSTAT) 0.4 MG SL tablet Place 1 tablet (0.4 mg total) under the tongue every 5 (five) minutes as needed for chest pain.   simvastatin (ZOCOR) 20 MG tablet Take 20 mg by mouth daily.   tamsulosin (FLOMAX) 0.4 MG CAPS capsule SMARTSIG:1 Capsule(s) By Mouth Every Evening   [DISCONTINUED] amLODipine (NORVASC) 10 MG tablet Take 1 tablet by mouth once daily (Patient taking differently: Take 5 mg by mouth 2 (two) times daily.)   Current Facility-Administered Medications for the 11/05/22 encounter (Office Visit) with Fay Records, MD  Medication   sodium chloride flush (NS) 0.9 % injection 3 mL     Allergies:   Amoxicillin   Past Medical History:  Diagnosis Date   Arthritis      Past Surgical History:  Procedure Laterality Date   [place]   BIOPSY THYROID Left 2012   CATARACT EXTRACTION W/PHACO Left 04/22/2014   Procedure: CATARACT EXTRACTION PHACO AND INTRAOCULAR LENS PLACEMENT ([place]);  Surgeon: Tonny Branch, MD;  Location: [facility];  Service: Ophthalmology;  Laterality: Left;  CDE:  9.68   CATARACT EXTRACTION W/PHACO Right 05/09/2014   Procedure: CATARACT EXTRACTION PHACO AND INTRAOCULAR LENS PLACEMENT RIGHT EYE CDE=15.76;  Surgeon: Tonny Branch, MD;  Location: [facility];  Service: Ophthalmology;  Laterality: Right;   LEFT HEART CATH AND CORONARY ANGIOGRAPHY N/A 07/06/2021   Procedure: LEFT HEART CATH AND CORONARY ANGIOGRAPHY;  Surgeon: Early Osmond, MD;  Location: [facility];  Service: Cardiovascular;  Laterality: N/A;     Social History:  The patient  reports that he has been smoking cigarettes. He has a 55.00 pack-year smoking history. He has never used smokeless tobacco. He reports that he does not drink alcohol and does not use drugs.   Family History:  The patient's family history includes Stroke in his father.    ROS:  Please see the history of present illness. All other systems are reviewed and  Negative to the above problem except as noted.    PHYSICAL EXAM: VS:  BP 128/66   Pulse 67   Ht 5\' 7"  (1.702 m)   Wt 162 lb (73.5 kg)   SpO2 96%   BMI 25.37 kg/m   GEN: Well nourished, well developed, in no acute distress  HEENT: normal  Neck: no JVD, carotid bruit Cardiac: RRR; no murmurs  No LE edema 2+ pulses  Respiratory:  clear to auscultation bilaterally GI: soft, nontender,No hepatomegaly   No masses MS: no deformity Moving all extremities   Skin: warm and dry, no rash Neuro:  Strength and sensation are intact Psych: euthymic mood, full affect   EKG:  EKG is ordered today.  NSR 67 bpm   Nonspecific ST changese   Cardiac studies  11/22 Cath:   Prox LAD lesion is 20% stenosed.   LV end diastolic pressure is  normal.   The left ventricular ejection fraction is 55-65% by visual estimate.   1.  Mild obstructive coronary artery disease. 2.  Normal ejection fraction with LVEDP of 7 mmHg.   TTE 11/22 (Care Everywhere)  1. The left ventricle is normal in size with normal wall thickness.    2. The left ventricular systolic function is normal, LVEF is visually  estimated at > 55%.    3. The left atrium is mildly dilated in size.    4. The right ventricle is normal in size, with normal systolic function.      Lipid Panel    Component Value Date/Time   CHOL 115 10/05/2021 0948   TRIG 161 (H) 10/05/2021 0948   HDL 51 10/05/2021 0948   CHOLHDL 2.3 10/05/2021 0948   LDLCALC 37 10/05/2021 0948      Wt Readings from Last 3 Encounters:  11/05/22 162 lb (73.5 kg)  10/05/21 161 lb (73 kg)  07/06/21 155 lb (70.3 kg)      ASSESSMENT AND PLAN:  1  CAD   Mild plaquing of LAD   Keep on statin  2   HTN   BP is controlled  Continue amlodipine  3  HL    LDL 49  HDL 54 (Oct 2023)  keep on statin  4  Tob   Continues to smoke  COunselled on cessation  Stay active   Will follow up in clinic next year    Current medicines are reviewed at length with the patient today.  The patient does not have concerns regarding medicines.  Signed, Dorris Carnes, MD  11/05/2022 2:07 PM    [place], [place], [place]  [address] Phone: [phone]; Fax: [phone]

## 2022-11-23 DIAGNOSIS — E041 Nontoxic single thyroid nodule: Secondary | ICD-10-CM | POA: Diagnosis not present

## 2022-11-23 DIAGNOSIS — R7303 Prediabetes: Secondary | ICD-10-CM | POA: Diagnosis not present

## 2022-11-23 DIAGNOSIS — I10 Essential (primary) hypertension: Secondary | ICD-10-CM | POA: Diagnosis not present

## 2022-11-23 DIAGNOSIS — E7849 Other hyperlipidemia: Secondary | ICD-10-CM | POA: Diagnosis not present

## 2022-11-23 DIAGNOSIS — E559 Vitamin D deficiency, unspecified: Secondary | ICD-10-CM | POA: Diagnosis not present

## 2022-12-03 DIAGNOSIS — Z6825 Body mass index (BMI) 25.0-25.9, adult: Secondary | ICD-10-CM | POA: Diagnosis not present

## 2022-12-03 DIAGNOSIS — Z720 Tobacco use: Secondary | ICD-10-CM | POA: Diagnosis not present

## 2022-12-03 DIAGNOSIS — N401 Enlarged prostate with lower urinary tract symptoms: Secondary | ICD-10-CM | POA: Diagnosis not present

## 2022-12-03 DIAGNOSIS — I240 Acute coronary thrombosis not resulting in myocardial infarction: Secondary | ICD-10-CM | POA: Diagnosis not present

## 2022-12-03 DIAGNOSIS — J439 Emphysema, unspecified: Secondary | ICD-10-CM | POA: Diagnosis not present

## 2022-12-03 DIAGNOSIS — R7303 Prediabetes: Secondary | ICD-10-CM | POA: Diagnosis not present

## 2022-12-03 DIAGNOSIS — I2510 Atherosclerotic heart disease of native coronary artery without angina pectoris: Secondary | ICD-10-CM | POA: Diagnosis not present

## 2022-12-03 DIAGNOSIS — E7849 Other hyperlipidemia: Secondary | ICD-10-CM | POA: Diagnosis not present

## 2022-12-03 DIAGNOSIS — I700 Atherosclerosis of aorta: Secondary | ICD-10-CM | POA: Diagnosis not present

## 2022-12-03 DIAGNOSIS — I10 Essential (primary) hypertension: Secondary | ICD-10-CM | POA: Diagnosis not present

## 2022-12-03 DIAGNOSIS — L4050 Arthropathic psoriasis, unspecified: Secondary | ICD-10-CM | POA: Diagnosis not present

## 2022-12-07 DIAGNOSIS — L0390 Cellulitis, unspecified: Secondary | ICD-10-CM | POA: Diagnosis not present

## 2022-12-07 DIAGNOSIS — F17210 Nicotine dependence, cigarettes, uncomplicated: Secondary | ICD-10-CM | POA: Diagnosis not present

## 2022-12-07 DIAGNOSIS — B3789 Other sites of candidiasis: Secondary | ICD-10-CM | POA: Diagnosis not present

## 2022-12-07 DIAGNOSIS — R030 Elevated blood-pressure reading, without diagnosis of hypertension: Secondary | ICD-10-CM | POA: Diagnosis not present

## 2022-12-07 DIAGNOSIS — Z6825 Body mass index (BMI) 25.0-25.9, adult: Secondary | ICD-10-CM | POA: Diagnosis not present

## 2022-12-09 DIAGNOSIS — F17210 Nicotine dependence, cigarettes, uncomplicated: Secondary | ICD-10-CM | POA: Diagnosis not present

## 2022-12-09 DIAGNOSIS — L0390 Cellulitis, unspecified: Secondary | ICD-10-CM | POA: Diagnosis not present

## 2022-12-09 DIAGNOSIS — B372 Candidiasis of skin and nail: Secondary | ICD-10-CM | POA: Diagnosis not present

## 2022-12-09 DIAGNOSIS — Z6825 Body mass index (BMI) 25.0-25.9, adult: Secondary | ICD-10-CM | POA: Diagnosis not present

## 2022-12-09 DIAGNOSIS — R030 Elevated blood-pressure reading, without diagnosis of hypertension: Secondary | ICD-10-CM | POA: Diagnosis not present

## 2022-12-16 ENCOUNTER — Other Ambulatory Visit: Payer: Self-pay | Admitting: Internal Medicine

## 2023-04-17 IMAGING — MR MR LUMBAR SPINE W/O CM
5 series · 31 of 48 positions shown · non-contrast
Comparison: None.

CLINICAL DATA: Chronic lower back pain.

EXAM:
MRI LUMBAR SPINE WITHOUT CONTRAST
TECHNIQUE: Multiplanar, multisequence MR imaging of the lumbar spine was
performed. No intravenous contrast was administered.

[Series 5: T2 · sagittal · 4.0mm · 0.68mm/px · 7 of 15 slices shown (1 of 2)]
[im 1/15]
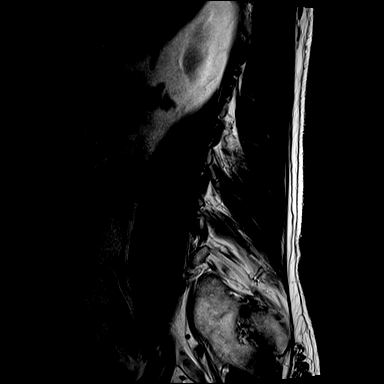
[im 3/15]
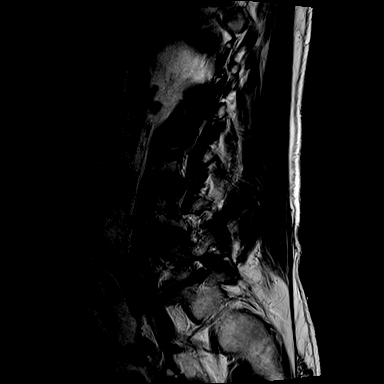
[im 5/15]
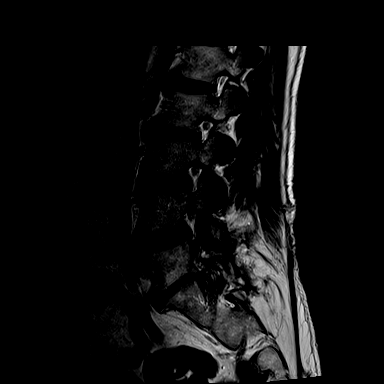
[im 8/15]
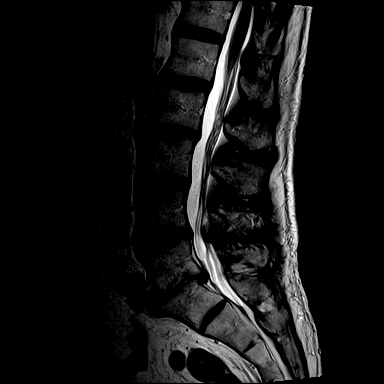
[im 10/15]
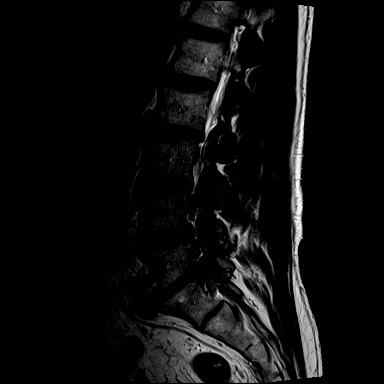
[im 12/15]
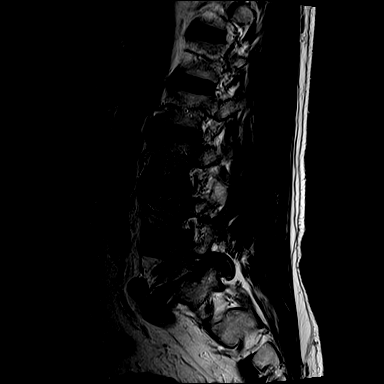
[im 15/15]
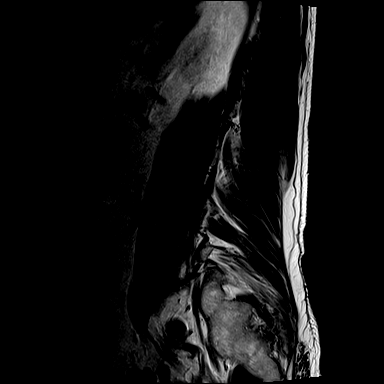

[Series 6: T1 · sagittal · 4.0mm · 0.81mm/px · 7 of 15 slices shown (1 of 2)]
[im 1/15]
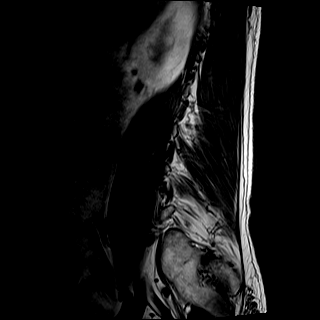
[im 3/15]
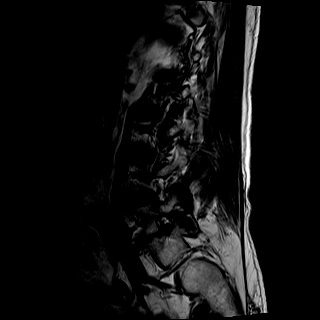
[im 5/15]
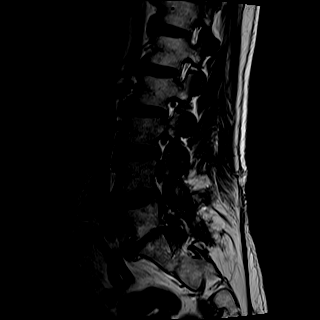
[im 8/15]
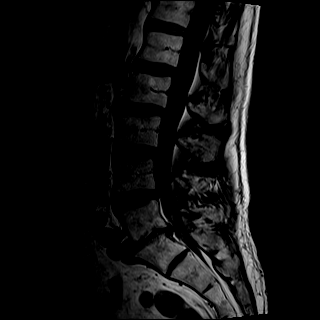
[im 10/15]
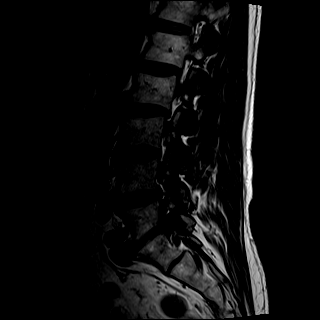
[im 12/15]
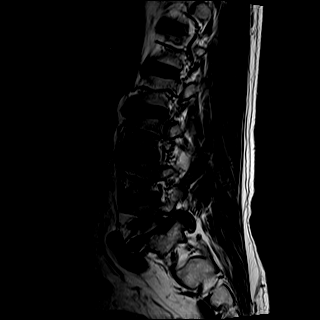
[im 15/15]
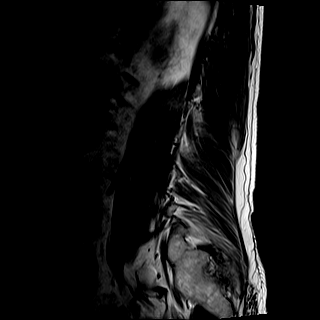

[Series 7: STIR · sagittal · 4.0mm · 0.51mm/px · 1 of 15 slices shown]
[im 1/15]
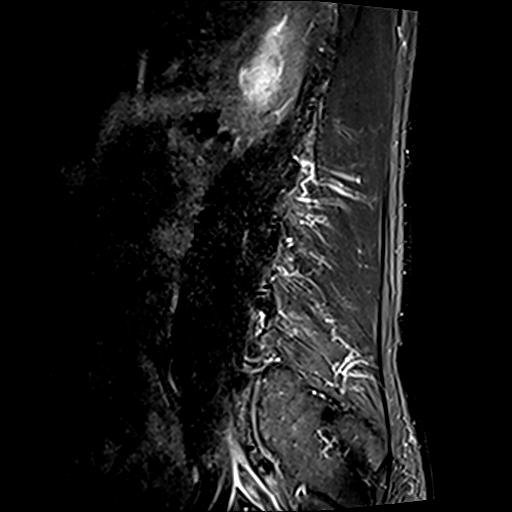

[Series 8: T2 · axial · 4.0mm · 0.70mm/px · z∈[-44,+157]mm · 8 of 33 slices shown (2 of 2)]
[im 1/33]
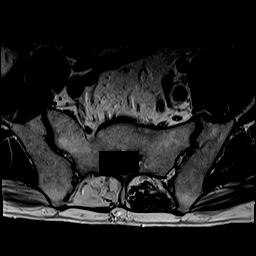
[im 5/33]
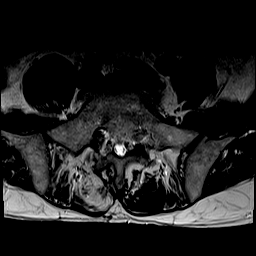
[im 10/33]
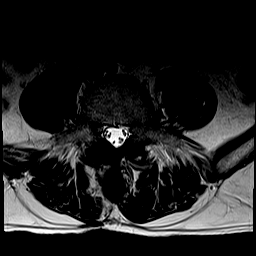
[im 15/33]
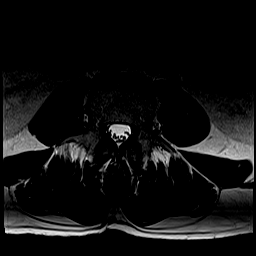
[im 18/33]
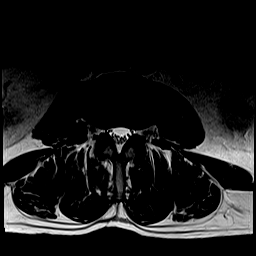
[im 23/33]
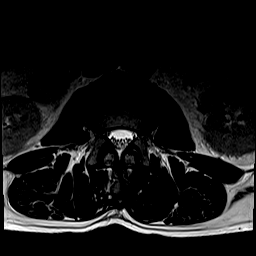
[im 28/33]
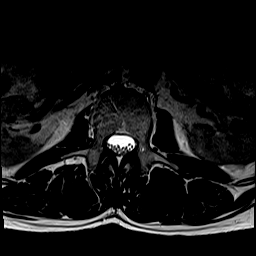
[im 33/33]
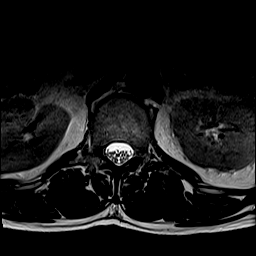

[Series 9: T1 · axial · 4.0mm · 0.35mm/px · z∈[-44,+157]mm · 8 of 33 slices shown (2 of 2)]
[im 1/33]
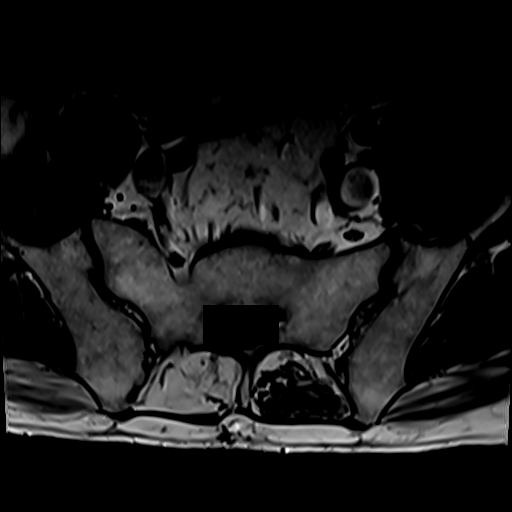
[im 5/33]
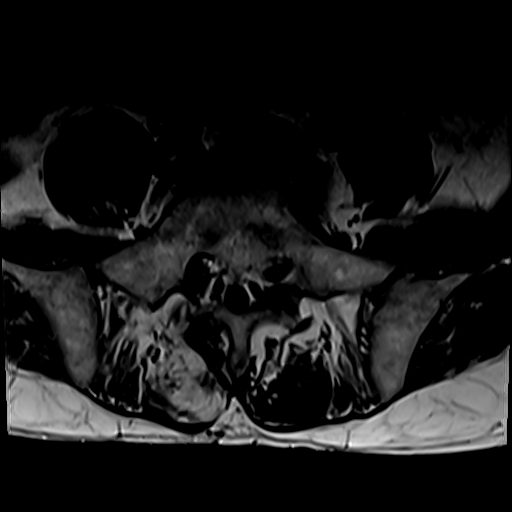
[im 10/33]
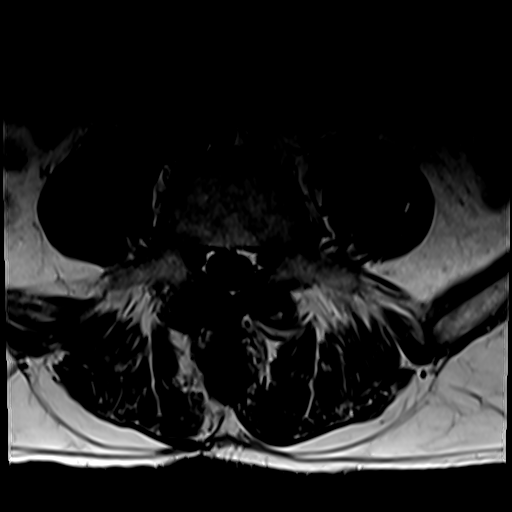
[im 15/33]
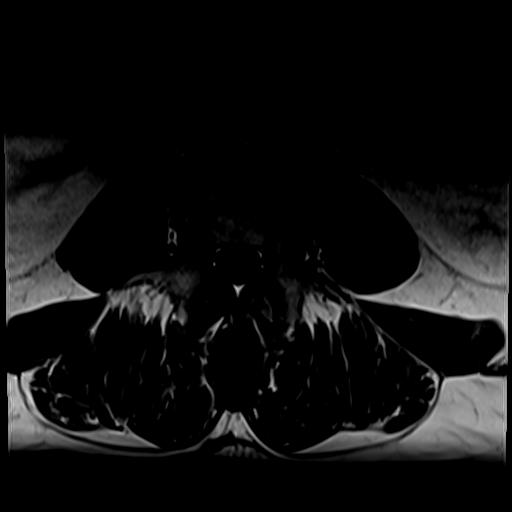
[im 18/33]
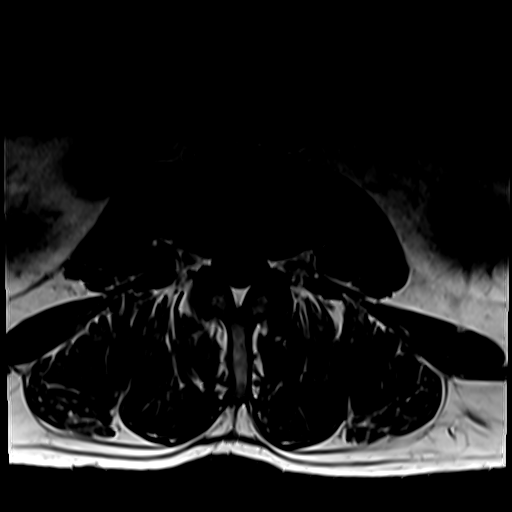
[im 23/33]
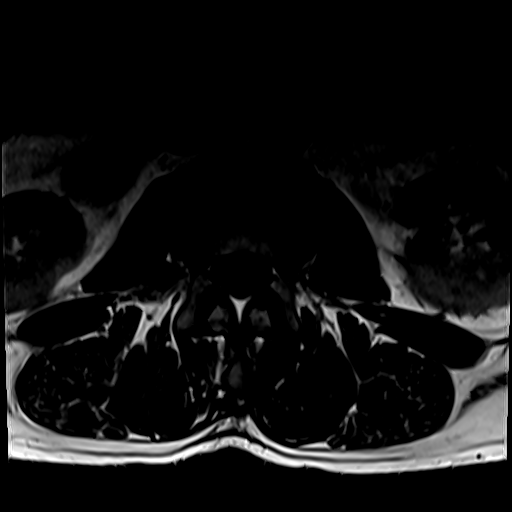
[im 28/33]
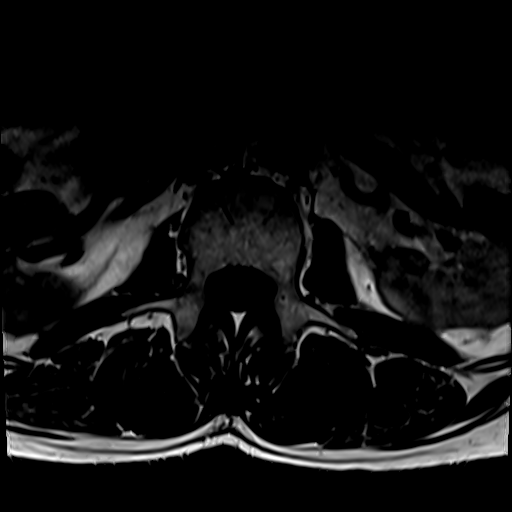
[im 33/33]
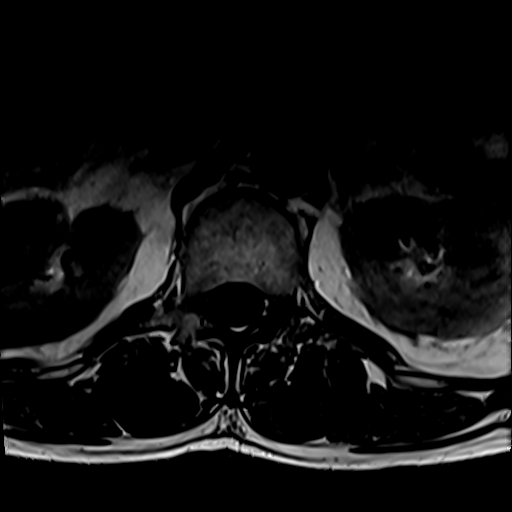

[31 of 48 positions shown; findings below may reference images not displayed]

FINDINGS: Segmentation: The lowest well-formed intervertebral disc space is
assumed to be the L5-S1 level.

Alignment: Grade 1 L5-S1 retrolisthesis. Trace L2-3 retrolisthesis.

Vertebrae: Modic type 2 endplate degenerative changes. No fracture
or aggressive osseous lesion.

Conus medullaris and cauda equina: Conus extends to the L1 level.
Conus and cauda equina appear normal.

Disc levels: Multilevel desiccation and disc space loss most
prominent at L5-S1.

L1-2: No significant disc bulge. Bilateral facet degenerative
spurring. Patent spinal canal and neural foramen.

L2-3: Mild disc bulge and facet degenerative spurring. Patent spinal
canal. Mild bilateral neural foraminal narrowing.

L3-4: Minimal disc bulge and facet degenerative spurring. Patent
spinal canal and neural foramen.

L4-5: Mild disc bulge and bilateral facet hypertrophy. Ligamentum
flavum thickening. There is abutment of the descending L5 nerve
roots. Mild spinal canal and mild left greater than right neural
foraminal narrowing.

L5-S1: Disc bulge with superimposed left foraminal protrusion
abutting the exiting left L5 nerve root. Sequela of right laminotomy
and partial right facetectomy. Left facet degenerative spurring.
There is also abutment of the left greater than right descending S1
nerve roots. Patent spinal canal. Severe left and moderate right
neural foraminal narrowing.

Paraspinal and other soft tissues: Postsurgical appearance of the
paraspinal soft tissues.
IMPRESSION: Sequela of right posterior decompression at the L5-S1 level.
Abutment of the exiting left L5 and descending S1 nerve roots with
severe left and moderate right neural foraminal narrowing.

Mild L4-5 spinal canal and mild L2-3, L4-5 bilateral neural
foraminal narrowing.

## 2023-04-27 DIAGNOSIS — M531 Cervicobrachial syndrome: Secondary | ICD-10-CM | POA: Diagnosis not present

## 2023-04-27 DIAGNOSIS — M9902 Segmental and somatic dysfunction of thoracic region: Secondary | ICD-10-CM | POA: Diagnosis not present

## 2023-04-27 DIAGNOSIS — M47816 Spondylosis without myelopathy or radiculopathy, lumbar region: Secondary | ICD-10-CM | POA: Diagnosis not present

## 2023-04-27 DIAGNOSIS — M9903 Segmental and somatic dysfunction of lumbar region: Secondary | ICD-10-CM | POA: Diagnosis not present

## 2023-04-27 DIAGNOSIS — M9901 Segmental and somatic dysfunction of cervical region: Secondary | ICD-10-CM | POA: Diagnosis not present

## 2023-04-27 DIAGNOSIS — S233XXA Sprain of ligaments of thoracic spine, initial encounter: Secondary | ICD-10-CM | POA: Diagnosis not present

## 2023-04-27 DIAGNOSIS — M47812 Spondylosis without myelopathy or radiculopathy, cervical region: Secondary | ICD-10-CM | POA: Diagnosis not present

## 2023-04-28 DIAGNOSIS — M47812 Spondylosis without myelopathy or radiculopathy, cervical region: Secondary | ICD-10-CM | POA: Diagnosis not present

## 2023-04-28 DIAGNOSIS — M9901 Segmental and somatic dysfunction of cervical region: Secondary | ICD-10-CM | POA: Diagnosis not present

## 2023-04-28 DIAGNOSIS — M9902 Segmental and somatic dysfunction of thoracic region: Secondary | ICD-10-CM | POA: Diagnosis not present

## 2023-04-28 DIAGNOSIS — M9903 Segmental and somatic dysfunction of lumbar region: Secondary | ICD-10-CM | POA: Diagnosis not present

## 2023-04-28 DIAGNOSIS — S233XXA Sprain of ligaments of thoracic spine, initial encounter: Secondary | ICD-10-CM | POA: Diagnosis not present

## 2023-04-28 DIAGNOSIS — M47816 Spondylosis without myelopathy or radiculopathy, lumbar region: Secondary | ICD-10-CM | POA: Diagnosis not present

## 2023-04-28 DIAGNOSIS — M531 Cervicobrachial syndrome: Secondary | ICD-10-CM | POA: Diagnosis not present

## 2023-05-05 DIAGNOSIS — M47812 Spondylosis without myelopathy or radiculopathy, cervical region: Secondary | ICD-10-CM | POA: Diagnosis not present

## 2023-05-05 DIAGNOSIS — M9902 Segmental and somatic dysfunction of thoracic region: Secondary | ICD-10-CM | POA: Diagnosis not present

## 2023-05-05 DIAGNOSIS — M9903 Segmental and somatic dysfunction of lumbar region: Secondary | ICD-10-CM | POA: Diagnosis not present

## 2023-05-05 DIAGNOSIS — M9901 Segmental and somatic dysfunction of cervical region: Secondary | ICD-10-CM | POA: Diagnosis not present

## 2023-05-05 DIAGNOSIS — M531 Cervicobrachial syndrome: Secondary | ICD-10-CM | POA: Diagnosis not present

## 2023-05-05 DIAGNOSIS — S233XXA Sprain of ligaments of thoracic spine, initial encounter: Secondary | ICD-10-CM | POA: Diagnosis not present

## 2023-05-05 DIAGNOSIS — M47816 Spondylosis without myelopathy or radiculopathy, lumbar region: Secondary | ICD-10-CM | POA: Diagnosis not present

## 2023-05-11 DIAGNOSIS — L4050 Arthropathic psoriasis, unspecified: Secondary | ICD-10-CM | POA: Diagnosis not present

## 2023-05-11 DIAGNOSIS — R030 Elevated blood-pressure reading, without diagnosis of hypertension: Secondary | ICD-10-CM | POA: Diagnosis not present

## 2023-05-11 DIAGNOSIS — Z6826 Body mass index (BMI) 26.0-26.9, adult: Secondary | ICD-10-CM | POA: Diagnosis not present

## 2023-05-11 DIAGNOSIS — S46211A Strain of muscle, fascia and tendon of other parts of biceps, right arm, initial encounter: Secondary | ICD-10-CM | POA: Diagnosis not present

## 2023-05-11 DIAGNOSIS — Z720 Tobacco use: Secondary | ICD-10-CM | POA: Diagnosis not present

## 2023-05-11 DIAGNOSIS — M19011 Primary osteoarthritis, right shoulder: Secondary | ICD-10-CM | POA: Diagnosis not present

## 2023-05-19 DIAGNOSIS — M47816 Spondylosis without myelopathy or radiculopathy, lumbar region: Secondary | ICD-10-CM | POA: Diagnosis not present

## 2023-05-19 DIAGNOSIS — M9901 Segmental and somatic dysfunction of cervical region: Secondary | ICD-10-CM | POA: Diagnosis not present

## 2023-05-19 DIAGNOSIS — M47812 Spondylosis without myelopathy or radiculopathy, cervical region: Secondary | ICD-10-CM | POA: Diagnosis not present

## 2023-05-19 DIAGNOSIS — M531 Cervicobrachial syndrome: Secondary | ICD-10-CM | POA: Diagnosis not present

## 2023-05-19 DIAGNOSIS — M9902 Segmental and somatic dysfunction of thoracic region: Secondary | ICD-10-CM | POA: Diagnosis not present

## 2023-05-19 DIAGNOSIS — S233XXA Sprain of ligaments of thoracic spine, initial encounter: Secondary | ICD-10-CM | POA: Diagnosis not present

## 2023-05-19 DIAGNOSIS — M9903 Segmental and somatic dysfunction of lumbar region: Secondary | ICD-10-CM | POA: Diagnosis not present

## 2023-06-02 DIAGNOSIS — I10 Essential (primary) hypertension: Secondary | ICD-10-CM | POA: Diagnosis not present

## 2023-06-02 DIAGNOSIS — E1165 Type 2 diabetes mellitus with hyperglycemia: Secondary | ICD-10-CM | POA: Diagnosis not present

## 2023-06-02 DIAGNOSIS — M47816 Spondylosis without myelopathy or radiculopathy, lumbar region: Secondary | ICD-10-CM | POA: Diagnosis not present

## 2023-06-02 DIAGNOSIS — E782 Mixed hyperlipidemia: Secondary | ICD-10-CM | POA: Diagnosis not present

## 2023-06-02 DIAGNOSIS — M531 Cervicobrachial syndrome: Secondary | ICD-10-CM | POA: Diagnosis not present

## 2023-06-02 DIAGNOSIS — S233XXA Sprain of ligaments of thoracic spine, initial encounter: Secondary | ICD-10-CM | POA: Diagnosis not present

## 2023-06-02 DIAGNOSIS — M9903 Segmental and somatic dysfunction of lumbar region: Secondary | ICD-10-CM | POA: Diagnosis not present

## 2023-06-02 DIAGNOSIS — M9902 Segmental and somatic dysfunction of thoracic region: Secondary | ICD-10-CM | POA: Diagnosis not present

## 2023-06-02 DIAGNOSIS — M47812 Spondylosis without myelopathy or radiculopathy, cervical region: Secondary | ICD-10-CM | POA: Diagnosis not present

## 2023-06-02 DIAGNOSIS — M9901 Segmental and somatic dysfunction of cervical region: Secondary | ICD-10-CM | POA: Diagnosis not present

## 2023-06-06 DIAGNOSIS — M9903 Segmental and somatic dysfunction of lumbar region: Secondary | ICD-10-CM | POA: Diagnosis not present

## 2023-06-06 DIAGNOSIS — M9902 Segmental and somatic dysfunction of thoracic region: Secondary | ICD-10-CM | POA: Diagnosis not present

## 2023-06-06 DIAGNOSIS — M9901 Segmental and somatic dysfunction of cervical region: Secondary | ICD-10-CM | POA: Diagnosis not present

## 2023-06-06 DIAGNOSIS — S233XXA Sprain of ligaments of thoracic spine, initial encounter: Secondary | ICD-10-CM | POA: Diagnosis not present

## 2023-06-06 DIAGNOSIS — M531 Cervicobrachial syndrome: Secondary | ICD-10-CM | POA: Diagnosis not present

## 2023-06-06 DIAGNOSIS — M47816 Spondylosis without myelopathy or radiculopathy, lumbar region: Secondary | ICD-10-CM | POA: Diagnosis not present

## 2023-06-06 DIAGNOSIS — M47812 Spondylosis without myelopathy or radiculopathy, cervical region: Secondary | ICD-10-CM | POA: Diagnosis not present

## 2023-09-08 ENCOUNTER — Other Ambulatory Visit: Payer: Self-pay | Admitting: Internal Medicine

## 2023-12-02 DIAGNOSIS — E1169 Type 2 diabetes mellitus with other specified complication: Secondary | ICD-10-CM | POA: Diagnosis not present

## 2023-12-18 NOTE — Progress Notes (Deleted)
 Appt cancelled

## 2023-12-20 ENCOUNTER — Ambulatory Visit: Payer: Medicare HMO | Admitting: Internal Medicine

## 2023-12-21 DIAGNOSIS — J439 Emphysema, unspecified: Secondary | ICD-10-CM | POA: Diagnosis not present

## 2023-12-21 DIAGNOSIS — E782 Mixed hyperlipidemia: Secondary | ICD-10-CM | POA: Diagnosis not present

## 2023-12-21 DIAGNOSIS — M5416 Radiculopathy, lumbar region: Secondary | ICD-10-CM | POA: Diagnosis not present

## 2023-12-21 DIAGNOSIS — E1169 Type 2 diabetes mellitus with other specified complication: Secondary | ICD-10-CM | POA: Diagnosis not present

## 2023-12-21 DIAGNOSIS — I240 Acute coronary thrombosis not resulting in myocardial infarction: Secondary | ICD-10-CM | POA: Diagnosis not present

## 2023-12-21 DIAGNOSIS — Z6825 Body mass index (BMI) 25.0-25.9, adult: Secondary | ICD-10-CM | POA: Diagnosis not present

## 2024-02-06 NOTE — Progress Notes (Signed)
 Cardiology Office Note   Date: 02/08/24   ID:  Dale Elliott, DOB 03-17-1946, MRN [id number]  PCP:  Lari Elspeth FORBES, MD  Cardiologist:   Vina Gull, MD   Follow up of CAD and HTN    History of Present Illness: Dale Elliott is a 78 y.o. male with a history chest tightness, and very mild CAD Also a hx of tobacco abuse   n 2022 had stress test that had high risk findings   Went on to have   LHC on 07/06/21  THis showed  LAD 20%, LVEF normal.      I saw the pt in March 2024  SInce seen he denies CP   No chest tightness.   He says his breathing is OK      Notes occasional dizziness with quick standing    DOes have some LE edema    Bothered by neuropathy in feet    Quit tobacco about 1 year ago  Currently uses breathing treatmens   Diet: Br:  Bacon and eggs    Snack during day  Nabs or oatmeal cookies   Dinner  Soup    Current Meds  Medication Sig   amitriptyline (ELAVIL) 50 MG tablet Take 50 mg by mouth at bedtime.   amLODipine  (NORVASC ) 5 MG tablet Take 1 tablet (5 mg total) by mouth daily.   Aspirin  81 MG CAPS Take 81 mg by mouth at bedtime.   Cholecalciferol (VITAMIN D3 SUPER STRENGTH) 50 MCG (2000 UT) TABS Take 2,000 Units by mouth daily.   gabapentin (NEURONTIN) 100 MG capsule Take 100 mg by mouth 2 (two) times daily.   halobetasol (ULTRAVATE) 0.05 % ointment Apply 1 application topically 2 (two) times daily as needed (Psoriasis).   HYDROcodone-Acetaminophen  7.5-300 MG TABS Take 1 tablet by mouth at bedtime as needed for pain.   nitroGLYCERIN  (NITROSTAT ) 0.4 MG SL tablet Place 1 tablet (0.4 mg total) under the tongue every 5 (five) minutes as needed for chest pain.   simvastatin (ZOCOR) 20 MG tablet Take 20 mg by mouth daily.   tamsulosin (FLOMAX) 0.4 MG CAPS capsule SMARTSIG:1 Capsule(s) By Mouth Every Evening   TRELEGY ELLIPTA 100-62.5-25 MCG/ACT AEPB Inhale 1 puff into the lungs daily.   [DISCONTINUED] amLODipine  (NORVASC ) 10 MG tablet Take 1 tablet by mouth  once daily   Current Facility-Administered Medications for the 02/08/24 encounter (Office Visit) with Gull Vina GAILS, MD  Medication   sodium chloride  flush (NS) 0.9 % injection 3 mL     Allergies:   Amoxicillin   Past Medical History:  Diagnosis Date   Arthritis     Past Surgical History:  Procedure Laterality Date   BACK SURGERY     1988 1990   BIOPSY THYROID  Left 2012   CATARACT EXTRACTION W/PHACO Left 04/22/2014   Procedure: CATARACT EXTRACTION PHACO AND INTRAOCULAR LENS PLACEMENT (IOC);  Surgeon: Cherene Mania, MD;  Location: [facility];  Service: Ophthalmology;  Laterality: Left;  CDE:  9.68   CATARACT EXTRACTION W/PHACO Right 05/09/2014   Procedure: CATARACT EXTRACTION PHACO AND INTRAOCULAR LENS PLACEMENT RIGHT EYE CDE=15.76;  Surgeon: Cherene Mania, MD;  Location: [facility];  Service: Ophthalmology;  Laterality: Right;   LEFT HEART CATH AND CORONARY ANGIOGRAPHY N/A 07/06/2021   Procedure: LEFT HEART CATH AND CORONARY ANGIOGRAPHY;  Surgeon: Wendel Lurena POUR, MD;  Location: [facility];  Service: Cardiovascular;  Laterality: N/A;     Social History:  The patient  reports that he has been  smoking cigarettes. He has a 55 pack-year smoking history. He has never used smokeless tobacco. He reports that he does not drink alcohol  and does not use drugs.   Family History:  The patient's family history includes Stroke in his father.    ROS:  Please see the history of present illness. All other systems are reviewed and  Negative to the above problem except as noted.    PHYSICAL EXAM: VS:  BP 130/60   Pulse 66   Ht 5' 7 (1.702 m)   Wt 166 lb 3.2 oz (75.4 kg)   SpO2 93%   BMI 26.03 kg/m   GEN: Well nourished, well developed, in no acute distress  HEENT: normal  Neck: no JVD, carotid bruits Cardiac: RRR; no murmurs  Tr  LE edema 2+ pulses  Respiratory:  clear to auscultation.  Moving air  GI: soft, nontender,No hepatomegaly   No masses MS: no deformity Moving all extremities     EKG:  EKG is ordered today.  NSR 66 bpm   Nonspecific ST changese   Cardiac studies  11/22 LHC   Prox LAD lesion is 20% stenosed.   LV end diastolic pressure is normal.   The left ventricular ejection fraction is 55-65% by visual estimate.   1.  Mild obstructive coronary artery disease. 2.  Normal ejection fraction with LVEDP of 7 mmHg.   TTE 11/22 (Care Everywhere)  1. The left ventricle is normal in size with normal wall thickness.    2. The left ventricular systolic function is normal, LVEF is visually  estimated at > 55%.    3. The left atrium is mildly dilated in size.    4. The right ventricle is normal in size, with normal systolic function.      Lipid Panel    Component Value Date/Time   CHOL 115 10/05/2021 0948   TRIG 144 02/08/2024 1200   HDL 61 02/08/2024 1200   CHOLHDL 2.3 10/05/2021 0948   LDLCALC 37 10/05/2021 0948      Wt Readings from Last 3 Encounters:  02/08/24 166 lb 3.2 oz (75.4 kg)  11/05/22 162 lb (73.5 kg)  10/05/21 161 lb (73 kg)      ASSESSMENT AND PLAN:  1  CAD   Mild plaquing of LAD  Pt remains asymptomatic   Continue risk factor modification.  2   HTN   BP is controlled  With complaints of ankle swelling would back down on amlodipine  to 5 mg    Follow up in  6 wks with BP check    See how swelling is  3  HL   Continue simvistatin   Check lipids   4  Tob  Congratulated on quitting     5  Metabolics   WIll check A1C   Counselling on diet   Limit UPF  Plan   BP check as noted     Otherwise follow up next year     Current medicines are reviewed at length with the patient today.  The patient does not have concerns regarding medicines.  Signed, Vina Gull, MD   [facility] [address] [place], [place], [place]  [address] Phone: [phone]; Fax: [phone]

## 2024-02-08 ENCOUNTER — Encounter: Payer: Self-pay | Admitting: Internal Medicine

## 2024-02-08 ENCOUNTER — Other Ambulatory Visit (HOSPITAL_COMMUNITY)
Admission: RE | Admit: 2024-02-08 | Discharge: 2024-02-08 | Disposition: A | Discharge status: Home / Self Care | Source: Ambulatory Visit | Attending: Internal Medicine | Admitting: Internal Medicine

## 2024-02-08 ENCOUNTER — Ambulatory Visit: Attending: Internal Medicine | Admitting: Internal Medicine

## 2024-02-08 VITALS — BP 130/60 | HR 66 | Ht 67.0 in | Wt 166.2 lb

## 2024-02-08 DIAGNOSIS — E785 Hyperlipidemia, unspecified: Secondary | ICD-10-CM | POA: Diagnosis not present

## 2024-02-08 DIAGNOSIS — I2510 Atherosclerotic heart disease of native coronary artery without angina pectoris: Secondary | ICD-10-CM | POA: Insufficient documentation

## 2024-02-08 DIAGNOSIS — Z9189 Other specified personal risk factors, not elsewhere classified: Secondary | ICD-10-CM | POA: Insufficient documentation

## 2024-02-08 DIAGNOSIS — I340 Nonrheumatic mitral (valve) insufficiency: Secondary | ICD-10-CM | POA: Diagnosis not present

## 2024-02-08 DIAGNOSIS — Z87891 Personal history of nicotine dependence: Secondary | ICD-10-CM | POA: Diagnosis not present

## 2024-02-08 DIAGNOSIS — R0789 Other chest pain: Secondary | ICD-10-CM | POA: Diagnosis not present

## 2024-02-08 DIAGNOSIS — I10 Essential (primary) hypertension: Secondary | ICD-10-CM | POA: Diagnosis not present

## 2024-02-08 DIAGNOSIS — Z79899 Other long term (current) drug therapy: Secondary | ICD-10-CM | POA: Insufficient documentation

## 2024-02-08 LAB — HEMOGLOBIN A1C
Hgb A1c MFr Bld: 6.4 % — ABNORMAL HIGH (ref 4.8–5.6)
Mean Plasma Glucose: 136.98 mg/dL

## 2024-02-08 MED ORDER — AMLODIPINE BESYLATE 5 MG PO TABS: 5.0000 mg | ORAL_TABLET | Freq: Every day | ORAL | 3 refills | Status: AC

## 2024-02-08 NOTE — Patient Instructions (Signed)
 Medication Instructions:   DECREASE Amlodipine  to 5 mg daily  Labwork:  MNR, A1c  Testing/Procedures: None today  Follow-Up: 6 weeks  Any Other Special Instructions Will Be Listed Below (If Applicable).   Keep daily BP and bring to appointment  If you need a refill on your cardiac medications before your next appointment, please call your pharmacy.

## 2024-02-09 LAB — NMR, LIPOPROFILE
Cholesterol, Total: 139 mg/dL (ref 100–199)
HDL Cholesterol by NMR: 61 mg/dL (ref >39)
HDL Particle Number: 38.5 umol/L (ref >=30.5)
LDL Particle Number: 535 nmol/L (ref <1000)
LDL Size: 20.3 nm — ABNORMAL LOW (ref >20.5)
LDL-C (NIH Calc): 54 mg/dL (ref 0–99)
LP-IR Score: 45 (ref <=45)
Small LDL Particle Number: 246 nmol/L (ref <=527)
Triglycerides by NMR: 144 mg/dL (ref 0–149)

## 2024-02-10 ENCOUNTER — Ambulatory Visit: Payer: Self-pay | Admitting: Internal Medicine

## 2024-03-19 DIAGNOSIS — M47812 Spondylosis without myelopathy or radiculopathy, cervical region: Secondary | ICD-10-CM | POA: Diagnosis not present

## 2024-03-19 DIAGNOSIS — M47816 Spondylosis without myelopathy or radiculopathy, lumbar region: Secondary | ICD-10-CM | POA: Diagnosis not present

## 2024-03-19 DIAGNOSIS — M9902 Segmental and somatic dysfunction of thoracic region: Secondary | ICD-10-CM | POA: Diagnosis not present

## 2024-03-19 DIAGNOSIS — M9901 Segmental and somatic dysfunction of cervical region: Secondary | ICD-10-CM | POA: Diagnosis not present

## 2024-03-19 DIAGNOSIS — S233XXA Sprain of ligaments of thoracic spine, initial encounter: Secondary | ICD-10-CM | POA: Diagnosis not present

## 2024-03-19 DIAGNOSIS — M531 Cervicobrachial syndrome: Secondary | ICD-10-CM | POA: Diagnosis not present

## 2024-03-19 DIAGNOSIS — M9903 Segmental and somatic dysfunction of lumbar region: Secondary | ICD-10-CM | POA: Diagnosis not present

## 2024-03-20 DIAGNOSIS — M9903 Segmental and somatic dysfunction of lumbar region: Secondary | ICD-10-CM | POA: Diagnosis not present

## 2024-03-20 DIAGNOSIS — S233XXA Sprain of ligaments of thoracic spine, initial encounter: Secondary | ICD-10-CM | POA: Diagnosis not present

## 2024-03-20 DIAGNOSIS — M9902 Segmental and somatic dysfunction of thoracic region: Secondary | ICD-10-CM | POA: Diagnosis not present

## 2024-03-20 DIAGNOSIS — M9901 Segmental and somatic dysfunction of cervical region: Secondary | ICD-10-CM | POA: Diagnosis not present

## 2024-03-20 DIAGNOSIS — M47816 Spondylosis without myelopathy or radiculopathy, lumbar region: Secondary | ICD-10-CM | POA: Diagnosis not present

## 2024-03-20 DIAGNOSIS — M531 Cervicobrachial syndrome: Secondary | ICD-10-CM | POA: Diagnosis not present

## 2024-03-20 DIAGNOSIS — M47812 Spondylosis without myelopathy or radiculopathy, cervical region: Secondary | ICD-10-CM | POA: Diagnosis not present

## 2024-03-20 NOTE — Progress Notes (Signed)
   Cardiology Office Note    Date:  03/21/2024  ID:  KENZEL RUESCH, DOB 1946/06/26, MRN [id number] Cardiologist: Vina Gull, MD Cardiology APP:  Johnson Laymon HERO, PA-C { : History of Present Illness:    Dale Elliott is a 78 y.o. male with past medical history of CAD (s/p cardiac catheterization 06/2021 showing mild, nonobstructive disease), HTN, HLD and prior tobacco use who presents to the office today for 6-week follow-up.  He was last examined by Dr. Gull in 02/2024 and denied any chest pain at that time but did report intermittent lower extremity edema along with dizziness when changing positions. Given his ankle swelling, Amlodipine  was reduced to 5 mg daily and follow-up was recommended in 6 weeks to reevaluate his blood pressure with the medication adjustment. He was continued on ASA 81 mg daily and Simvastatin 20 mg daily.  In talking with the patient today, he reports having baseline dyspnea on exertion in the setting of COPD but symptoms did improve after being switched to Breztri. Quit smoking over 1.5 years ago. No specific orthopnea or PND. Reports his lower extremity edema significantly improved after Amlodipine  was reduced at the time of his last office visit. He has been keeping a detailed blood pressure log and BP has overall been well-controlled with SBP typically in the 110's to 120's and diastolic readings in the 60's to 70's. He denies any recent chest pain or palpitations.  Studies Reviewed:   EKG: EKG is not ordered today.  Cardiac Catheterization: 06/2021   Prox LAD lesion is 20% stenosed.   LV end diastolic pressure is normal.   The left ventricular ejection fraction is 55-65% by visual estimate.   1.  Mild obstructive coronary artery disease. 2.  Normal ejection fraction with LVEDP of 7 mmHg.   Recommendation: Medical therapy and evaluation for noncardiac etiologies of chest pain.   Physical Exam:   VS:  BP 110/60 (BP Location: Left Arm, Cuff Size:  Normal)   Pulse 87   Ht 5' 7 (1.702 m)   Wt 168 lb 3.2 oz (76.3 kg)   SpO2 94%   BMI 26.34 kg/m    Wt Readings from Last 3 Encounters:  03/21/24 168 lb 3.2 oz (76.3 kg)  02/08/24 166 lb 3.2 oz (75.4 kg)  11/05/22 162 lb (73.5 kg)     GEN: Well nourished, well developed male appearing in no acute distress NECK: No JVD; No carotid bruits CARDIAC: RRR, no murmurs, rubs, gallops RESPIRATORY:  Clear to auscultation without rales, wheezing or rhonchi  ABDOMEN: Appears non-distended. No obvious abdominal masses. EXTREMITIES: No clubbing or cyanosis. No pitting edema.  Distal pedal pulses are 2+ bilaterally.   Assessment and Plan:   1. Coronary artery disease involving native coronary artery of native heart without angina pectoris - Catheterization 06/2021 showed 20% stenosis along the proximal LAD but no obstructive disease. He has baseline dyspnea on exertion in the setting of COPD but no acute changes in this or recent chest pain.  - Continue ASA 81 mg daily and Simvastatin 20 mg daily.  2. Hyperlipidemia LDL goal <70 - LDL was at 54 when checked in 02/2024. Continue current medical therapy with Simvastatin 20 mg daily. Would not further titrate given the concurrent use of Amlodipine .   3. Essential hypertension - BP is well-controlled at 110/60 during today's visit has been well-controlled when checked at home. Continue Amlodipine  5mg  daily.    Signed, Laymon HERO Johnson, PA-C

## 2024-03-21 ENCOUNTER — Encounter: Payer: Self-pay | Admitting: Student

## 2024-03-21 ENCOUNTER — Ambulatory Visit: Attending: Student | Admitting: Student

## 2024-03-21 VITALS — BP 110/60 | HR 87 | Ht 67.0 in | Wt 168.2 lb

## 2024-03-21 DIAGNOSIS — I10 Essential (primary) hypertension: Secondary | ICD-10-CM

## 2024-03-21 DIAGNOSIS — I2510 Atherosclerotic heart disease of native coronary artery without angina pectoris: Secondary | ICD-10-CM

## 2024-03-21 DIAGNOSIS — E785 Hyperlipidemia, unspecified: Secondary | ICD-10-CM | POA: Diagnosis not present

## 2024-03-21 NOTE — Patient Instructions (Signed)
 Medication Instructions:   Continue current medication regimen.   *If you need a refill on your cardiac medications before your next appointment, please call your pharmacy*  Follow-Up: At [facility], you and your health needs are our priority.  As part of our continuing mission to provide you with exceptional heart care, our providers are all part of one team.  This team includes your primary Cardiologist (physician) and Advanced Practice Providers or APPs (Physician Assistants and Nurse Practitioners) who all work together to provide you with the care you need, when you need it.  Your next appointment:   1 year(s)  Provider:   You may see Vina Gull, MD or one of the following Advanced Practice Providers on your designated Care Team:   Laymon Qua, PA-C  [place], [place] Olivia [place], [place]     We recommend signing up for the patient portal called MyChart.  Sign up information is provided on this After Visit Summary.  MyChart is used to connect with patients for Virtual Visits (Telemedicine).  Patients are able to view lab/test results, encounter notes, upcoming appointments, etc.  Non-urgent messages can be sent to your provider as well.   To learn more about what you can do with MyChart, go to [email or web address].
